# Patient Record
Sex: Male | Born: 1955 | Race: White | Hispanic: No | Marital: Married | Smoking: Never smoker
Health system: Southern US, Community
[De-identification: ages and names within clinical notes are randomized; demographics above are authoritative.]

## PROBLEM LIST (undated history)

## (undated) DIAGNOSIS — Z86010 Personal history of colonic polyps: Principal | ICD-10-CM

## (undated) DIAGNOSIS — K635 Polyp of colon: Secondary | ICD-10-CM

## (undated) HISTORY — PX: COLONOSCOPY: SHX174

## (undated) HISTORY — DX: Personal history of colonic polyps: Z86.010

## (undated) HISTORY — DX: Polyp of colon: K63.5

---

## 1983-09-06 HISTORY — PX: WISDOM TOOTH EXTRACTION: SHX21

## 1983-09-06 HISTORY — PX: KNEE SURGERY: SHX244

## 2011-05-17 DIAGNOSIS — Z860101 Personal history of adenomatous and serrated colon polyps: Secondary | ICD-10-CM

## 2011-05-17 DIAGNOSIS — Z86010 Personal history of colonic polyps: Secondary | ICD-10-CM

## 2011-05-17 HISTORY — DX: Personal history of colonic polyps: Z86.010

## 2011-05-17 HISTORY — DX: Personal history of adenomatous and serrated colon polyps: Z86.0101

## 2011-05-27 ENCOUNTER — Encounter (INDEPENDENT_AMBULATORY_CARE_PROVIDER_SITE_OTHER): Payer: Self-pay | Admitting: General Surgery

## 2011-06-01 ENCOUNTER — Encounter (INDEPENDENT_AMBULATORY_CARE_PROVIDER_SITE_OTHER): Payer: Self-pay | Admitting: General Surgery

## 2011-06-01 ENCOUNTER — Ambulatory Visit (INDEPENDENT_AMBULATORY_CARE_PROVIDER_SITE_OTHER): Payer: BC Managed Care – PPO | Admitting: General Surgery

## 2011-06-01 VITALS — BP 150/98 | HR 68 | Temp 97.1°F | Resp 16 | Ht 76.0 in | Wt 261.6 lb

## 2011-06-01 DIAGNOSIS — D126 Benign neoplasm of colon, unspecified: Secondary | ICD-10-CM

## 2011-06-01 NOTE — Progress Notes (Signed)
Subjective:     Patient ID: Patrick Newton, male   DOB: [birth year], 55 y.o.   MRN: [id number]  HPI Patient presents for evaluation for colectomy. Patient underwent screening colonoscopy by Dr. Bosie Clos. He was found to have a large proximal descending colon polyp. 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 The polyp was somewhat large and it was tattooed. He has had some small descending colon polyps removed. These are only tubular adenomas with no high-grade dysplasia. He has been doing well since his colonoscopy. He has not had change in bowel or bladder habits. He is having no abdominal pain  Review of Systems  Constitutional: Negative.   HENT: Negative.   Eyes: Negative.   Respiratory: Negative.   Cardiovascular: Negative.   Gastrointestinal: Negative.   Genitourinary: Negative.   Musculoskeletal: Negative.   Neurological: Negative.   Hematological: Negative.   Psychiatric/Behavioral: Negative.        Objective:   Physical Exam  Constitutional: He is oriented to person, place, and time. He appears well-developed and well-nourished.  HENT:  Head: Normocephalic and atraumatic.  Eyes: Conjunctivae are normal. Pupils are equal, round, and reactive to light. Right eye exhibits no discharge.  Neck: Normal range of motion. Neck supple. No tracheal deviation present.  Cardiovascular: Normal rate, regular rhythm and normal heart sounds.   Pulmonary/Chest: Effort normal and breath sounds normal. No respiratory distress. He has no wheezes. He has no rales. He exhibits no tenderness.  Abdominal: Soft. He exhibits no distension and no mass. There is no tenderness. There is no rebound and no guarding.  Musculoskeletal: Normal range of motion.  Neurological: He is alert and oriented to person, place, and time.  Skin: Skin is warm and dry.       Assessment:     Large tubulovillous adenoma proximal right colon.    Plan:     I have offered right colectomy. procedure risk and benefits  were discussed in detail the patient. These included but were not limited to bleeding, infection, injury to other organs, cardiac, and pulmonary palpitations. We also discussed the risk of anastomotic failure. We will plan a bowel prep. The patient had many questions that we discussed thoroughly. He is agreeable and looks forward to scheduling within the next few weeks.

## 2011-06-08 ENCOUNTER — Telehealth (INDEPENDENT_AMBULATORY_CARE_PROVIDER_SITE_OTHER): Payer: Self-pay | Admitting: General Surgery

## 2011-06-08 NOTE — Telephone Encounter (Signed)
Pt called in stated he is schedule for sx on 10/15 for right colectomy. Wanted to know if he can get a flu shot. Stated he never had one before. I asked Cyndra Numbers to reconfirm, and advised pt the shot should not be a problem.

## 2011-06-13 ENCOUNTER — Encounter (HOSPITAL_COMMUNITY)
Admission: RE | Admit: 2011-06-13 | Discharge: 2011-06-13 | Disposition: A | Discharge status: Home / Self Care | Payer: BC Managed Care – PPO | Source: Ambulatory Visit | Attending: General Surgery | Admitting: General Surgery

## 2011-06-13 LAB — SURGICAL PCR SCREEN
MRSA, PCR: NEGATIVE
Staphylococcus aureus: POSITIVE — AB

## 2011-06-13 LAB — CBC
Hemoglobin: 15 g/dL (ref 13.0–17.0)
MCV: 86.1 fL (ref 78.0–100.0)
Platelets: 206 10*3/uL (ref 150–400)
RBC: 5.02 MIL/uL (ref 4.22–5.81)
WBC: 5 10*3/uL (ref 4.0–10.5)

## 2011-06-13 LAB — BASIC METABOLIC PANEL
CO2: 32 mEq/L (ref 19–32)
Chloride: 103 mEq/L (ref 96–112)
Sodium: 141 mEq/L (ref 135–145)

## 2011-06-13 LAB — TYPE AND SCREEN
ABO/RH(D): A POS
Antibody Screen: NEGATIVE

## 2011-06-14 NOTE — Progress Notes (Signed)
I called and spoke to [facility] at [facility] preop and told her this was ok for or.

## 2011-06-20 ENCOUNTER — Other Ambulatory Visit (INDEPENDENT_AMBULATORY_CARE_PROVIDER_SITE_OTHER): Payer: Self-pay | Admitting: General Surgery

## 2011-06-20 ENCOUNTER — Inpatient Hospital Stay (HOSPITAL_COMMUNITY)
Admission: RE | Admit: 2011-06-20 | Discharge: 2011-06-25 | DRG: 148 | Disposition: A | Discharge status: Home / Self Care | Payer: BC Managed Care – PPO | Source: Ambulatory Visit | Attending: General Surgery | Admitting: General Surgery

## 2011-06-20 DIAGNOSIS — Z7982 Long term (current) use of aspirin: Secondary | ICD-10-CM

## 2011-06-20 DIAGNOSIS — D126 Benign neoplasm of colon, unspecified: Secondary | ICD-10-CM

## 2011-06-20 DIAGNOSIS — K560 Paralytic ileus: Secondary | ICD-10-CM | POA: Diagnosis not present

## 2011-06-20 DIAGNOSIS — R339 Retention of urine, unspecified: Secondary | ICD-10-CM | POA: Diagnosis not present

## 2011-06-20 DIAGNOSIS — Z01812 Encounter for preprocedural laboratory examination: Secondary | ICD-10-CM

## 2011-06-20 HISTORY — PX: COLON SURGERY: SHX602

## 2011-06-21 LAB — BASIC METABOLIC PANEL
Calcium: 8.6 mg/dL (ref 8.4–10.5)
Chloride: 104 mEq/L (ref 96–112)
Creatinine, Ser: 1.12 mg/dL (ref 0.50–1.35)
GFR calc Af Amer: 84 mL/min — ABNORMAL LOW (ref >90)
GFR calc non Af Amer: 73 mL/min — ABNORMAL LOW (ref >90)

## 2011-06-21 LAB — CBC
MCH: 29.3 pg (ref 26.0–34.0)
MCHC: 34 g/dL (ref 30.0–36.0)
MCV: 86.2 fL (ref 78.0–100.0)
Platelets: 203 10*3/uL (ref 150–400)
RDW: 13.5 % (ref 11.5–15.5)
WBC: 12.3 10*3/uL — ABNORMAL HIGH (ref 4.0–10.5)

## 2011-06-21 NOTE — Op Note (Signed)
Patrick Newton, SAWIN NO.:  [id number]  MEDICAL RECORD NO.:  [id number]  LOCATION:  5118                         FACILITY:  [facility]  PHYSICIAN:  Gabrielle Dare. Janee Morn, M.D.DATE OF BIRTH:  07-26-1956  DATE OF PROCEDURE:  06/20/2011 DATE OF DISCHARGE:                              OPERATIVE REPORT   PREOPERATIVE DIAGNOSIS:  Tubulovillous adenoma of right colon.  POSTOPERATIVE DIAGNOSIS:  Tubulovillous adenoma of right colon.  PROCEDURE:  Right colectomy.  SURGEON:  Gabrielle Dare. Janee Morn, MD  ASSISTANT:  Anselm Pancoast. Zachery Dakins, MD  HISTORY OF PRESENT ILLNESS:  Patrick Newton is a 55 year old gentleman who underwent screening colonoscopy by Dr. Bosie Clos.  He was found to have a tubulovillous adenoma in the proximal right colon.  He also had some smaller pedunculated tubular adenomas removed from his left colon.  The right colon polyp was large and unable to be totally removed.  It was tattooed during endoscopy.  I evaluated him in the office and we are proceeding with right colectomy.  PROCEDURE IN DETAIL:  Informed consent was obtained.  The patient was identified in the preop holding area.  His site was marked.  He received intravenous antibiotics.  He was brought to the operating room and general endotracheal anesthesia was administered by the Anesthesia staff.  His abdomen was prepped and draped in usual sterile fashion after placement of Foley catheter by nursing staff.  We did a time-out procedure.  A limited midline incision was made.  Subcutaneous tissues were dissected down revealing the anterior fascia.  This was divided along the midline, and the peritoneal cavity was entered under direct vision without difficulty.  Peritoneum was opened to the length of the incision.  Exploration revealed a palpable polyp in the proximal right colon with tattoo dye visible.  There were no other masses palpable in the right colon or transverse colon.  The liver was smooth both in  the right lobe and left lobe.  There were some small lymph nodes along the right colon mesentery, however, none appeared enlarged or abnormal.  The right colon was mobilized from its lateral peritoneal attachments along the white line of Toldt starting at the cecum movement up the right gutter.  This mobilization continued up and around the hepatic flexure. This hepatic flexure was somewhat high in order to keep our main incision small.  Attention was then directed to the mid transverse colon.  We selected an area to the right of the middle colic vessels and the colon was divided with a GIA 75 stapler.  We then took down the omentum and gastrocolic omentum there.  This facilitated easier mobilization of the hepatic flexure.  The mesentery of the transverse colon was then taken down for short distance with  the LigaSure achieving good hemostasis.  We were then able to sweep down the rest of the hepatic flexure.  It had completely mobilized.  The duodenum was swept down away.  This gave [place] good exposure of the mesentery with good mobilization.  The mesentery was then divided with the LigaSure for short distance, then as we encountered vessels including the right colon and ileum.  These were clamped and suture ligated with 2-0  silk and then doubly ligated with 2-0 silk.  After flashing, we continued taking the mesentery with a good lymph node dissection in this fashion.  Once we got down towards the terminal ileum, terminal ileum was freed up a little bit more.  The ileal fat pad was dissected free in that this terminal ileum was divided with a GIA 75 stapler.  We took down the rest of the mesentery as described above.  The specimen was passed off.  The gutter was inspected, there was no bleeding.  The transverse colon was then oriented side-by-side to the terminal ileum making ensure the remaining terminal ileum was nicely aligned with no twisting.  We placed a 2-0 silk crotch stitch and a  side-to-side anastomosis was made with a GIA 75 stapler.  The staple lines were checked, and there was no bleeding.  The resultant enterotomy was closed with a TA 60 stapler. Please note the bowel prep appeared good.  We placed a couple of figure- of-eight 3-0 silk sutures along the staple line to get excellent hemostasis.  We changed our gloves and got over the dirty instruments. The mesenteric defect was then closed with a series of interrupted 2-0 silk figure-of-eight sutures.  The anastomosis remained palpably patent. The intestine was viable as well.  The crotched area was reinspected and an additional 2-0 silk crotch stitch was placed and tied securely.  The abdomen was copiously irrigated.  There was no bleeding along the gutter or along the mesenteric pedicle.  The anastomosis was reinspected and remained completely viable.  Bowel and the omentum was returned back into anatomic position.  Irrigation fluid was evacuated and it was clear.  The fascia was then closed with 2 lengths of running #1 looped PDS from each end of the incision and tied in the middle.  Subcutaneous tissues were copiously irrigated, and the skin was closed with staples with a few interspersed Telfa wicks.  A bulky sterile dressing was applied.  All sponge, needle and instruments were reported correct. There were no apparent complications.  The patient tolerated the procedure well without apparent complication and was taken to the recovery room in stable condition.     Gabrielle Dare Janee Morn, M.D.     BET/MEDQ  D:  06/20/2011  T:  06/21/2011  Job:  [id number]  cc:   Shirley Friar, MD  Electronically Signed by Violeta Gelinas M.D. on 06/21/2011 01:32:45 PM

## 2011-06-22 DIAGNOSIS — R339 Retention of urine, unspecified: Secondary | ICD-10-CM

## 2011-06-28 ENCOUNTER — Telehealth (INDEPENDENT_AMBULATORY_CARE_PROVIDER_SITE_OTHER): Payer: Self-pay

## 2011-06-28 NOTE — Telephone Encounter (Signed)
Pt calling in to report that he has had some numbness and tingling at his quadricep muscle but it's not affecting his mobility or strength. The pt is still walking a day and nothing has gotten worse. I advised the pt if this gets worse to call our office back we would need to get pt in for appt. [facility]

## 2011-07-03 NOTE — Discharge Summary (Signed)
  Patrick Newton, Patrick Newton NO.:  [id number]  MEDICAL RECORD NO.:  [id number]  LOCATION:  5152                         FACILITY:  [facility]  PHYSICIAN:  Gabrielle Dare. Janee Morn, M.D.DATE OF BIRTH:  1956/08/03  DATE OF ADMISSION:  06/20/2011 DATE OF DISCHARGE:  06/25/2011                              DISCHARGE SUMMARY   DISCHARGE DIAGNOSES: 1. Tubulovillous adenoma right colon. 2. Status post right colectomy. 3. Urinary retention - resolved.  HISTORY OF PRESENT ILLNESS:  Mr. Patrick Newton is a 55 year old gentleman who presented for elective right colectomy for a large tubulovillous adenoma.  HOSPITAL COURSE:  The patient underwent an uncomplicated right colectomy.  Postoperatively, he was maintained on Entereg protocol.  His ileus gradually resolved.  He remained afebrile and hemodynamically stable.  He had some urinary retention after removal of his Foley, this improved after treatment with Urecholine, and is able to pass his urine easily for 48 hours prior to discharge.  He had good control of his pain and was moving his bowels and tolerated advancement of his diet and is discharged home on postoperative day 5 in stable condition.  DISCHARGE DIET:  Regular.  DISCHARGE ACTIVITY:  No lifting.  DISCHARGE MEDICATIONS:  Hydrocodone/acetaminophen 5/325 one to two p.o. q.4 h. p.r.n. pain.  He is also to continue his home medications of multivitamin and Tylenol and aspirin.  Followup is as scheduled with myself on July 06, 2011.     Gabrielle Dare Janee Morn, M.D.     BET/MEDQ  D:  06/25/2011  T:  06/25/2011  Job:  [id number]  cc:   Shirley Friar, MD  Electronically Signed by Violeta Gelinas M.D. on 07/03/2011 08:50:16 AM

## 2011-07-06 ENCOUNTER — Encounter (INDEPENDENT_AMBULATORY_CARE_PROVIDER_SITE_OTHER): Payer: Self-pay | Admitting: General Surgery

## 2011-07-06 ENCOUNTER — Ambulatory Visit (INDEPENDENT_AMBULATORY_CARE_PROVIDER_SITE_OTHER): Payer: BC Managed Care – PPO | Admitting: General Surgery

## 2011-07-06 DIAGNOSIS — Z9889 Other specified postprocedural states: Secondary | ICD-10-CM

## 2011-07-06 DIAGNOSIS — D126 Benign neoplasm of colon, unspecified: Secondary | ICD-10-CM

## 2011-07-06 DIAGNOSIS — K635 Polyp of colon: Secondary | ICD-10-CM

## 2011-07-06 DIAGNOSIS — Z9049 Acquired absence of other specified parts of digestive tract: Secondary | ICD-10-CM

## 2011-07-06 NOTE — Progress Notes (Signed)
Subjective:     Patient ID: Patrick Newton, male   DOB: 1956/06/15, 55 y.o.   MRN: [id number]  HPI  Patient presents status post right colectomy for large tubulovillous adenoma. He is doing well postoperatively. He is eating and moving his bowels usually once or twice a day. He's had no subjective fevers. He is no longer taking pain medicine. Review of Systems     Objective:   Physical Exam    Abdomen soft and nontender. Bowel sounds are active. His incision is healing well. Staples were removed and Steri-Strips were placed.Assessment:     Doing very well after right colectomy for tubulovillous adenoma.    Plan:     Avoid heavy lifting for 6 weeks after surgery. I will see him back in 3 weeks. Several questions are answered regarding activity. He is doing very well.

## 2011-07-06 NOTE — Patient Instructions (Signed)
No lifting over 10lbs for 6 weeks after surgery

## 2011-08-03 ENCOUNTER — Ambulatory Visit (INDEPENDENT_AMBULATORY_CARE_PROVIDER_SITE_OTHER): Payer: BC Managed Care – PPO | Admitting: General Surgery

## 2011-08-03 ENCOUNTER — Encounter (INDEPENDENT_AMBULATORY_CARE_PROVIDER_SITE_OTHER): Payer: Self-pay | Admitting: General Surgery

## 2011-08-03 VITALS — BP 164/92 | HR 62 | Temp 97.0°F | Resp 16 | Ht 76.0 in | Wt 257.5 lb

## 2011-08-03 DIAGNOSIS — D126 Benign neoplasm of colon, unspecified: Secondary | ICD-10-CM

## 2011-08-03 DIAGNOSIS — K635 Polyp of colon: Secondary | ICD-10-CM

## 2011-08-03 NOTE — Patient Instructions (Signed)
You may begin lifting including weights now.  Increase activity gradually.

## 2011-08-03 NOTE — Progress Notes (Signed)
Patient ID: Patrick Newton, male   DOB: Oct 20, 1955, 55 y.o.   MRN: [id number] Patient presents status post right colectomy for large tubulovillous adenoma on June 20, 2011. He's been doing well postoperatively. He is eating and moving his bowels at least daily. He has been holding off on any heavy lifting. He is not having any significant pain. He occasionally has some muscular strain type pain of his abdominal wall. On physical exam patient is awake and alert Abdomen is soft and nontender. His incision is well-healed without signs of infection. There no hernias. No masses are felt. Impression doing very well after right colectomy.  Plan he may gradually returned to normal activities including lifting weights. He will do this gradually over the next 2 weeks. He will follow up with gastroenterology for another colonoscopy in one year. I will see him back as needed.

## 2011-11-01 ENCOUNTER — Other Ambulatory Visit: Payer: Self-pay | Admitting: Dermatology

## 2013-07-09 ENCOUNTER — Other Ambulatory Visit: Payer: Self-pay | Admitting: Gastroenterology

## 2013-07-30 ENCOUNTER — Encounter (HOSPITAL_BASED_OUTPATIENT_CLINIC_OR_DEPARTMENT_OTHER): Payer: Self-pay | Admitting: Emergency Medicine

## 2013-07-30 ENCOUNTER — Emergency Department (HOSPITAL_BASED_OUTPATIENT_CLINIC_OR_DEPARTMENT_OTHER)
Admission: EM | Admit: 2013-07-30 | Discharge: 2013-07-30 | Disposition: A | Discharge status: Home / Self Care | Payer: BC Managed Care – PPO | Attending: Emergency Medicine | Admitting: Emergency Medicine

## 2013-07-30 ENCOUNTER — Emergency Department (HOSPITAL_BASED_OUTPATIENT_CLINIC_OR_DEPARTMENT_OTHER): Payer: BC Managed Care – PPO

## 2013-07-30 DIAGNOSIS — I10 Essential (primary) hypertension: Secondary | ICD-10-CM | POA: Insufficient documentation

## 2013-07-30 DIAGNOSIS — R1011 Right upper quadrant pain: Secondary | ICD-10-CM | POA: Insufficient documentation

## 2013-07-30 DIAGNOSIS — K8020 Calculus of gallbladder without cholecystitis without obstruction: Secondary | ICD-10-CM | POA: Insufficient documentation

## 2013-07-30 DIAGNOSIS — Z9089 Acquired absence of other organs: Secondary | ICD-10-CM | POA: Insufficient documentation

## 2013-07-30 LAB — COMPREHENSIVE METABOLIC PANEL
AST: 18 U/L (ref 0–37)
Albumin: 4.5 g/dL (ref 3.5–5.2)
Alkaline Phosphatase: 54 U/L (ref 39–117)
Calcium: 9.6 mg/dL (ref 8.4–10.5)
Creatinine, Ser: 1.2 mg/dL (ref 0.50–1.35)
GFR calc Af Amer: 76 mL/min — ABNORMAL LOW (ref >90)
GFR calc non Af Amer: 66 mL/min — ABNORMAL LOW (ref >90)
Sodium: 139 mEq/L (ref 135–145)
Total Bilirubin: 0.4 mg/dL (ref 0.3–1.2)
Total Protein: 7.1 g/dL (ref 6.0–8.3)

## 2013-07-30 LAB — CBC WITH DIFFERENTIAL/PLATELET
Basophils Relative: 0 % (ref 0–1)
Eosinophils Absolute: 0 10*3/uL (ref 0.0–0.7)
Hemoglobin: 14.3 g/dL (ref 13.0–17.0)
MCH: 29.4 pg (ref 26.0–34.0)
MCHC: 34.5 g/dL (ref 30.0–36.0)
Monocytes Absolute: 0.6 10*3/uL (ref 0.1–1.0)
Monocytes Relative: 5 % (ref 3–12)
Neutrophils Relative %: 85 % — ABNORMAL HIGH (ref 43–77)
RDW: 13.2 % (ref 11.5–15.5)

## 2013-07-30 LAB — LIPASE, BLOOD: Lipase: 49 U/L (ref 11–59)

## 2013-07-30 LAB — URINALYSIS, ROUTINE W REFLEX MICROSCOPIC
Bilirubin Urine: NEGATIVE
Ketones, ur: 15 mg/dL — AB
Nitrite: NEGATIVE
Protein, ur: NEGATIVE mg/dL
Urobilinogen, UA: 0.2 mg/dL (ref 0.0–1.0)
pH: 6 (ref 5.0–8.0)

## 2013-07-30 MED ORDER — ONDANSETRON HCL 4 MG/2ML IJ SOLN
4.0000 mg | Freq: Once | INTRAMUSCULAR | Status: AC
  Administered 2013-07-30: 4 mg via INTRAVENOUS
  Filled 2013-07-30: qty 2

## 2013-07-30 MED ORDER — SODIUM CHLORIDE 0.9 % IV SOLN
1000.0000 mL | Freq: Once | INTRAVENOUS | Status: AC
  Administered 2013-07-30: 1000 mL via INTRAVENOUS

## 2013-07-30 MED ORDER — IOHEXOL 300 MG/ML  SOLN
100.0000 mL | Freq: Once | INTRAMUSCULAR | Status: AC | PRN
  Administered 2013-07-30: 100 mL via INTRAVENOUS

## 2013-07-30 MED ORDER — SODIUM CHLORIDE 0.9 % IV SOLN
1000.0000 mL | INTRAVENOUS | Status: DC
  Administered 2013-07-30: 1000 mL via INTRAVENOUS

## 2013-07-30 MED ORDER — HYDROCODONE-ACETAMINOPHEN 5-325 MG PO TABS: 1.0000 | ORAL_TABLET | ORAL | Status: DC | PRN

## 2013-07-30 MED ORDER — HYDROMORPHONE HCL PF 1 MG/ML IJ SOLN
0.5000 mg | INTRAMUSCULAR | Status: DC | PRN
  Administered 2013-07-30: 0.5 mg via INTRAVENOUS
  Filled 2013-07-30: qty 1

## 2013-07-30 MED ORDER — IOHEXOL 300 MG/ML  SOLN
50.0000 mL | Freq: Once | INTRAMUSCULAR | Status: AC | PRN
  Administered 2013-07-30: 50 mL via ORAL

## 2013-07-30 MED ORDER — ONDANSETRON 8 MG PO TBDP: 8.0000 mg | ORAL_TABLET | Freq: Three times a day (TID) | ORAL | Status: DC | PRN

## 2013-07-30 NOTE — ED Provider Notes (Signed)
CSN: [id number]     Arrival date & time 07/30/13  0055 History   First MD Initiated Contact with Patient 07/30/13 0107     Chief Complaint  Patient presents with  . Abdominal Pain    Patient is a 57 y.o. male presenting with abdominal pain. The history is provided by the patient.  Abdominal Pain Pain location:  Epigastric and RUQ Pain quality: aching   Pain radiation: up towards the chest. Pain severity:  Moderate Onset quality:  Gradual Duration:  4 hours Timing:  Constant Progression:  Worsening Chronicity:  New Context: eating and previous surgery   Context: not retching and not trauma   Relieved by:  Nothing Worsened by:  Nothing tried Ineffective treatments:  None tried Associated symptoms: no anorexia, no chest pain, no chills, no cough, no diarrhea, no dysuria, no fever, no nausea and no vomiting     Past Medical History  Diagnosis Date  . Hypertension    Past Surgical History  Procedure Laterality Date  . Wisdom tooth extraction  1985  . Knee surgery  1985    arthroscopic  . Colon surgery  06/20/2011    rt colectomy   Family History  Problem Relation Age of Onset  . Cancer Mother     pancreatic  . Cancer Father     abdominal  . Cancer Maternal Aunt     breast   History  Substance Use Topics  . Smoking status: Never Smoker   . Smokeless tobacco: Never Used  . Alcohol Use: Yes     Comment: one beer every 2 weeks or less    Review of Systems  Constitutional: Negative for fever and chills.  Respiratory: Negative for cough.   Cardiovascular: Negative for chest pain.  Gastrointestinal: Positive for abdominal pain. Negative for nausea, vomiting, diarrhea and anorexia.  Genitourinary: Negative for dysuria.  All other systems reviewed and are negative.    Allergies  Review of patient's allergies indicates no known allergies.  Home Medications   Current Outpatient Rx  Name  Route  Sig  Dispense  Refill  . HYDROcodone-acetaminophen (NORCO/VICODIN)  5-325 MG per tablet   Oral   Take 1-2 tablets by mouth every 4 (four) hours as needed.   30 tablet   0   . ondansetron (ZOFRAN ODT) 8 MG disintegrating tablet   Oral   Take 1 tablet (8 mg total) by mouth every 8 (eight) hours as needed for nausea or vomiting.   20 tablet   0    BP 159/99  Pulse 52  Temp(Src) 98.1 F (36.7 C) (Oral)  Resp 20  SpO2 100% Physical Exam  Nursing note and vitals reviewed. Constitutional: He appears well-developed and well-nourished. No distress.  HENT:  Head: Normocephalic and atraumatic.  Right Ear: External ear normal.  Left Ear: External ear normal.  Eyes: Conjunctivae are normal. Right eye exhibits no discharge. Left eye exhibits no discharge. No scleral icterus.  Neck: Neck supple. No tracheal deviation present.  Cardiovascular: Normal rate, regular rhythm and intact distal pulses.   Pulmonary/Chest: Effort normal and breath sounds normal. No stridor. No respiratory distress. He has no wheezes. He has no rales.  Abdominal: Soft. Bowel sounds are normal. He exhibits no distension. There is tenderness in the right upper quadrant and epigastric area. There is guarding. There is no rigidity and no rebound. No hernia.  Well healed colectomy scar  Musculoskeletal: He exhibits no edema and no tenderness.  Neurological: He is alert. He has normal  strength. No sensory deficit. Cranial nerve deficit:  no gross defecits noted. He exhibits normal muscle tone. He displays no seizure activity. Coordination normal.  Skin: Skin is warm and dry. No rash noted.  Psychiatric: He has a normal mood and affect.    ED Course  Procedures (including critical care time) Labs Review Labs Reviewed  COMPREHENSIVE METABOLIC PANEL - Abnormal; Notable for the following:    Glucose, Bld 162 (*)    GFR calc non Af Amer 66 (*)    GFR calc Af Amer 76 (*)    All other components within normal limits  URINALYSIS, ROUTINE W REFLEX MICROSCOPIC - Abnormal; Notable for the  following:    Ketones, ur 15 (*)    All other components within normal limits  CBC WITH DIFFERENTIAL - Abnormal; Notable for the following:    WBC 11.6 (*)    Neutrophils Relative % 85 (*)    Neutro Abs 9.9 (*)    Lymphocytes Relative 9 (*)    All other components within normal limits  LIPASE, BLOOD   Imaging Review Ct Abdomen Pelvis W Contrast  07/30/2013   CLINICAL DATA:  Right lower quadrant pain and nausea. History of appendectomy, hemicolectomy, and colon polyps.  EXAM: CT ABDOMEN AND PELVIS WITH CONTRAST  TECHNIQUE: Multidetector CT imaging of the abdomen and pelvis was performed using the standard protocol following bolus administration of intravenous contrast.  CONTRAST:  50mL OMNIPAQUE IOHEXOL 300 MG/ML SOLN, OMNIPAQUE IOHEXOL 300 MG/ML SOLN  COMPARISON:  None.  FINDINGS: Mild dependent atelectasis in the lung bases.  Cholelithiasis and sludge in the gallbladder. No pericholecystic infiltration. The liver, spleen, pancreas, adrenal glands, abdominal aorta, inferior vena cava, and retroperitoneal lymph nodes are unremarkable. Small cysts on the kidneys measuring less than 1 cm diameter. No hydronephrosis. The stomach, small bowel, and colon are not abnormally distended. Minimal upper anterior abdominal wall hernia containing fat. No free fluid or free air in the abdomen.  Pelvis: The prostate gland is enlarged. The bladder wall is not thickened. The appendix is surgically absent. No evidence of diverticulitis. No free or loculated pelvic fluid collections. Ileal transverse colonic anastomosis appears patent. No significant lymphadenopathy in the pelvis. Degenerative changes in the lumbar spine. No destructive bone lesions appreciated.  IMPRESSION: Cholelithiasis and sludge in the gallbladder. No inflammatory changes around the gallbladder. Postoperative changes. Prostatic enlargement. Minimal fat containing upper anterior abdominal wall hernia.   Electronically Signed   By: Burman Nieves M.D.   On: 07/30/2013 03:39    EKG Interpretation    Date/Time:  Tuesday July 30 2013 01:09:41 EST Ventricular Rate:  50 PR Interval:  154 QRS Duration: 130 QT Interval:  456 QTC Calculation: 415 R Axis:   -36 Text Interpretation:  Sinus bradycardia Left axis deviation Right bundle branch block Moderate voltage criteria for LVH, may be normal variant Abnormal ECG No previous tracing Confirmed by Saloma Cadena  MD-J, Eunice Oldaker ([id number]) on 07/30/2013 1:20:23 AM           Medications  0.9 %  sodium chloride infusion (0 mLs Intravenous Stopped 07/30/13 0233)    Followed by  0.9 %  sodium chloride infusion (1,000 mLs Intravenous New Bag/Given 07/30/13 0235)  HYDROmorphone (DILAUDID) injection 0.5 mg (0.5 mg Intravenous Given 07/30/13 0140)  ondansetron (ZOFRAN) injection 4 mg (4 mg Intravenous Given 07/30/13 0140)  iohexol (OMNIPAQUE) 300 MG/ML solution 50 mL (50 mLs Oral Contrast Given 07/30/13 0310)  iohexol (OMNIPAQUE) 300 MG/ML solution 100 mL (100 mLs Intravenous  Contrast Given 07/30/13 0314)    0403  No abdominal ttp now after treatment. MDM   1. Cholelithiasis    Symptomatic biliary colic.  Exam and CT scan do not suggest cholecystitis.  Recc close follow up with surgery.  Discussed warning signs and precautions    Celene Kras, MD 07/30/13 [phone]

## 2013-07-30 NOTE — ED Notes (Signed)
C/o upper abd pain since 9pm, denies any diaphoresis. Denies N/V.

## 2013-08-07 ENCOUNTER — Ambulatory Visit (INDEPENDENT_AMBULATORY_CARE_PROVIDER_SITE_OTHER): Payer: BC Managed Care – PPO | Admitting: General Surgery

## 2013-08-07 ENCOUNTER — Encounter (INDEPENDENT_AMBULATORY_CARE_PROVIDER_SITE_OTHER): Payer: Self-pay | Admitting: General Surgery

## 2013-08-07 VITALS — BP 132/84 | HR 66 | Temp 97.1°F | Resp 16 | Ht 76.0 in | Wt 265.6 lb

## 2013-08-07 DIAGNOSIS — K8020 Calculus of gallbladder without cholecystitis without obstruction: Secondary | ICD-10-CM | POA: Insufficient documentation

## 2013-08-07 NOTE — Patient Instructions (Signed)
I will call you once ultrasound results are back

## 2013-08-07 NOTE — Progress Notes (Signed)
Patient ID: Patrick Newton, male   DOB: [birth year], 57 y.o.   MRN: [id number]  No chief complaint on file.   HPI Patrick Newton is a 57 y.o. male.  Chief complaint: Gallstones HPI The patient very well status post right colectomy. He has been doing well postoperatively. He recently had a colonoscopy with removal of a few polyps there has anastomosis. One was a tubulovillous adenoma. He recently traveled to [place] to see his brothers. On return, he developed acute right upper quadrant abdominal pain. He was seen at [facility] where he underwent a CT scan of the abdomen and pelvis. This demonstrated gallstones without evidence of acute cholecystitis. He has been on a low-fat diet since then and his pain is resolved. It did take a few days to completely go away.His mother, of note, history of pancreatic cancer and he is concerned regarding this.  Past Medical History  Diagnosis Date  . Hypertension     Past Surgical History  Procedure Laterality Date  . Wisdom tooth extraction  1985  . Knee surgery  1985    arthroscopic  . Colon surgery  06/20/2011    rt colectomy    Family History  Problem Relation Age of Onset  . Cancer Mother     pancreatic  . Cancer Father     abdominal  . Cancer Maternal Aunt     breast    Social History History  Substance Use Topics  . Smoking status: Never Smoker   . Smokeless tobacco: Never Used  . Alcohol Use: Yes     Comment: one beer every 2 weeks or less    No Known Allergies  No current outpatient prescriptions on file.   No current facility-administered medications for this visit.    Review of Systems Review of Systems  Constitutional: Negative.   HENT: Negative.   Eyes: Negative.   Respiratory: Negative.   Cardiovascular: Negative.   Gastrointestinal: Positive for abdominal pain.       See history of present illness  Endocrine: Negative.   Genitourinary: Negative.   Musculoskeletal: Negative.   Allergic/Immunologic:  Negative.   Neurological: Negative.   Hematological: Negative.   Psychiatric/Behavioral: Negative.     Blood pressure 132/84, pulse 66, temperature 97.1 F (36.2 C), temperature source Temporal, resp. rate 16, height 6\' 4"  (1.93 m), weight 265 lb 9.6 oz (120.475 kg).  Physical Exam Physical Exam  Constitutional: He is oriented to person, place, and time. He appears well-developed and well-nourished. No distress.  HENT:  Head: Normocephalic and atraumatic.  Mouth/Throat: No oropharyngeal exudate.  Eyes: EOM are normal. Pupils are equal, round, and reactive to light. No scleral icterus.  Neck: Normal range of motion. No tracheal deviation present. No thyromegaly present.  Cardiovascular: Normal rate, regular rhythm, normal heart sounds and intact distal pulses.   Pulmonary/Chest: Effort normal and breath sounds normal. No stridor. No respiratory distress. He has no wheezes. He has no rales.  Abdominal: Soft. Bowel sounds are normal. He exhibits no distension and no mass. There is no tenderness. There is no rebound and no guarding.  Small incisional hernia at the upper portion of his scar remains spontaneously reduced throughout exam, fascial defect only 1-2 cm. No right upper quadrant tenderness.  Musculoskeletal: Normal range of motion.  Neurological: He is alert and oriented to person, place, and time.  Skin: Skin is warm and dry.  Psychiatric: He has a normal mood and affect.    Data Reviewed CT  Assessment    Symptomatic cholelithiasis, small incisional hernia    Plan    I've offered laparoscopic cholecystectomy. We discussed the procedure, risks, and benefits in detail. We discussed the increased risk for converting to open  due to previous abdominal surgery with his right colectomy. He would like some more information prior to surgery and would like to have an ultrasound done. I believe this will add some further information. He is concerned regarding his mother's history  pancreatic cancer. I reviewed his CT results with him. I do not feel Histamal incisional hernia needs urgent surgery at this time. We will obtain the ultrasound and I will call him and we will discuss further plans at that time.       Taydem Cavagnaro E 08/07/2013, 11:57 AM

## 2013-08-08 ENCOUNTER — Ambulatory Visit
Admission: RE | Admit: 2013-08-08 | Discharge: 2013-08-08 | Disposition: A | Discharge status: Home / Self Care | Payer: BC Managed Care – PPO | Source: Ambulatory Visit | Attending: General Surgery | Admitting: General Surgery

## 2013-08-08 DIAGNOSIS — K8020 Calculus of gallbladder without cholecystitis without obstruction: Secondary | ICD-10-CM

## 2013-08-12 ENCOUNTER — Other Ambulatory Visit (INDEPENDENT_AMBULATORY_CARE_PROVIDER_SITE_OTHER): Payer: Self-pay | Admitting: General Surgery

## 2013-08-12 ENCOUNTER — Telehealth (INDEPENDENT_AMBULATORY_CARE_PROVIDER_SITE_OTHER): Payer: Self-pay | Admitting: General Surgery

## 2013-08-12 NOTE — Telephone Encounter (Signed)
I spoke to him about his U/S results. He is considering laparoscopic cholecystectomy in January. I discussed the procedure in detail. We discussed the risks and benefits of a laparoscopic cholecystectomy and possible cholangiogram including, but not limited to bleeding, infection, injury to surrounding structures such as the intestine or liver, bile leak, retained gallstones, need to convert to an open procedure, prolonged diarrhea, blood clots such as  DVT, common bile duct injury, anesthesia risks, and possible need for additional procedures.  The likelihood of improvement in symptoms and return to the patient's normal status is good. We discussed the typical post-operative recovery course. He will call scheduling once he speaks with his wife.

## 2013-08-14 ENCOUNTER — Encounter (INDEPENDENT_AMBULATORY_CARE_PROVIDER_SITE_OTHER): Payer: BC Managed Care – PPO | Admitting: General Surgery

## 2014-01-16 ENCOUNTER — Other Ambulatory Visit: Payer: Self-pay | Admitting: Dermatology

## 2014-04-17 IMAGING — CT CT ABD-PELV W/ CM
2 of 5 series · 17 of 46 positions shown, 19 images · IV contrast (APPLIED)
Comparison: None.

CLINICAL DATA: Right lower quadrant pain and nausea. History of
appendectomy, hemicolectomy, and colon polyps.

EXAM:
CT ABDOMEN AND PELVIS WITH CONTRAST
TECHNIQUE: Multidetector CT imaging of the abdomen and pelvis was performed
using the standard protocol following bolus administration of
intravenous contrast.
CONTRAST:  50mL OMNIPAQUE IOHEXOL 300 MG/ML SOLN, 100mL OMNIPAQUE
IOHEXOL 300 MG/ML SOLN

[Series 2: abd/pelvis 5.0 b31f · axial · 0.91mm/px · z∈[-496,-31]mm · 14 of 105 slices shown, 16 images]
[im 6/105  soft-tissue]
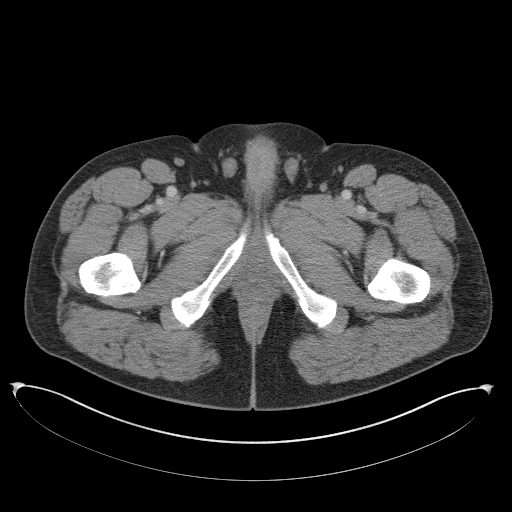
[im 6/105  bone]
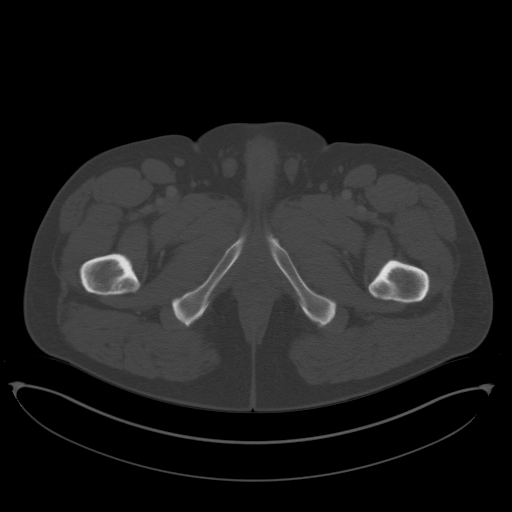
[im 11/105  soft-tissue]
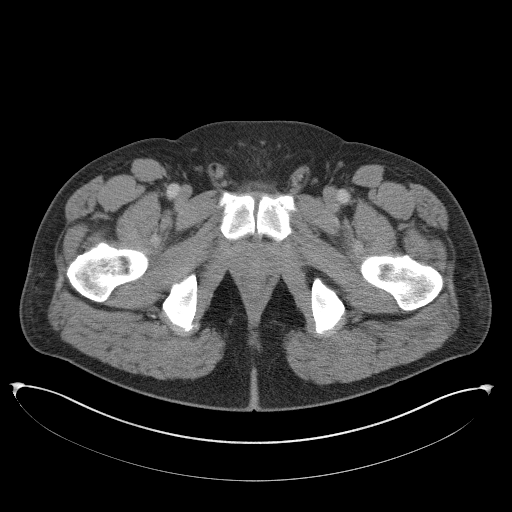
[im 22/105  soft-tissue]
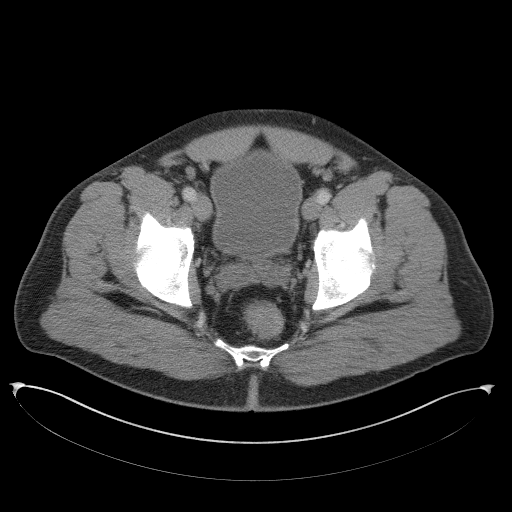
[im 28/105  soft-tissue]
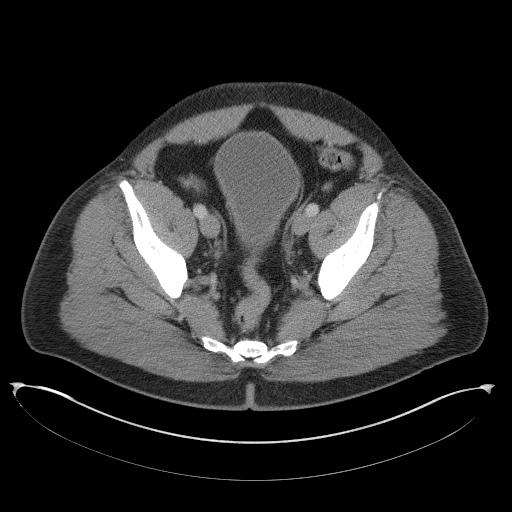
[im 33/105  soft-tissue]
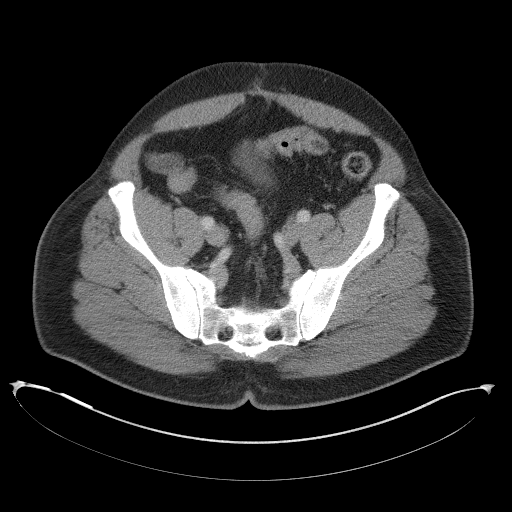
[im 44/105  soft-tissue]
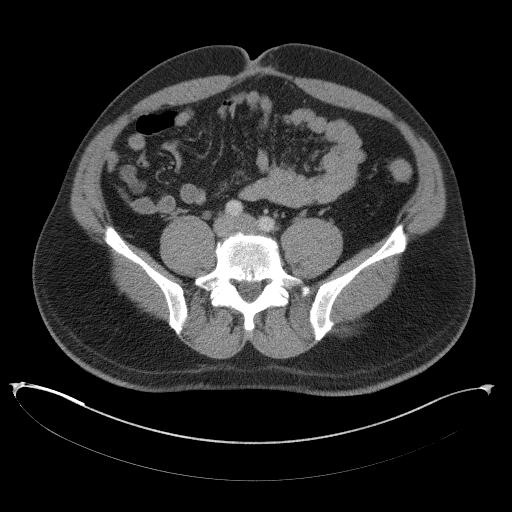
[im 50/105  soft-tissue]
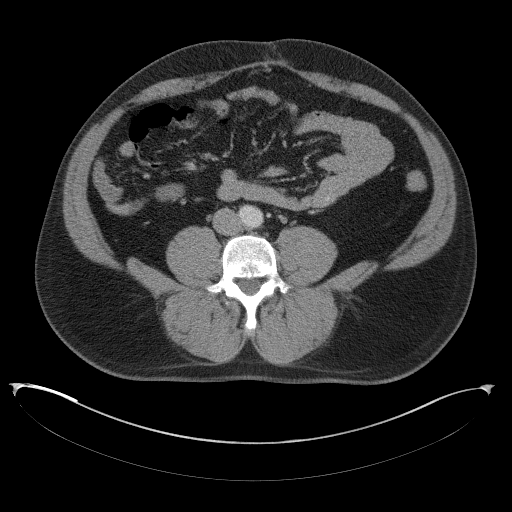
[im 55/105  soft-tissue]
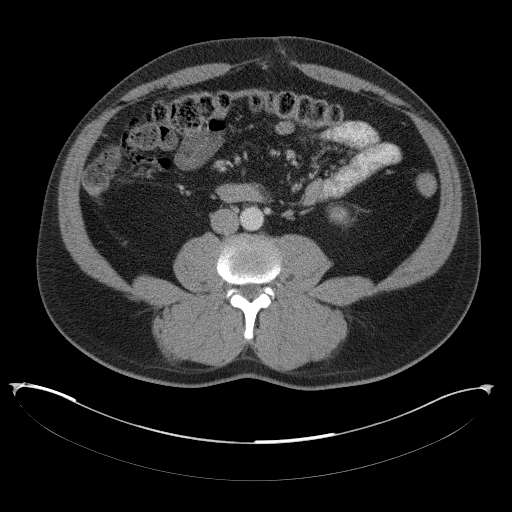
[im 61/105  soft-tissue]
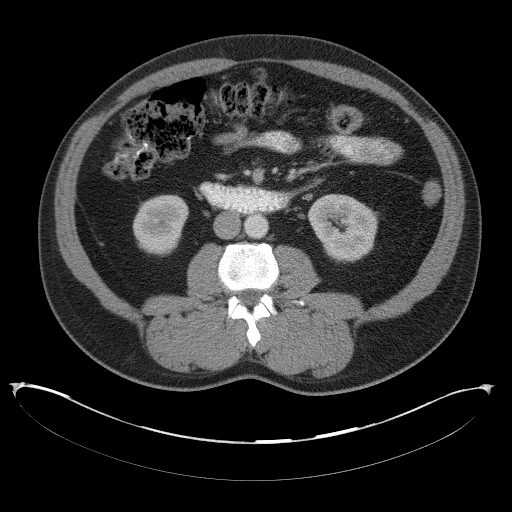
[im 61/105  bone]
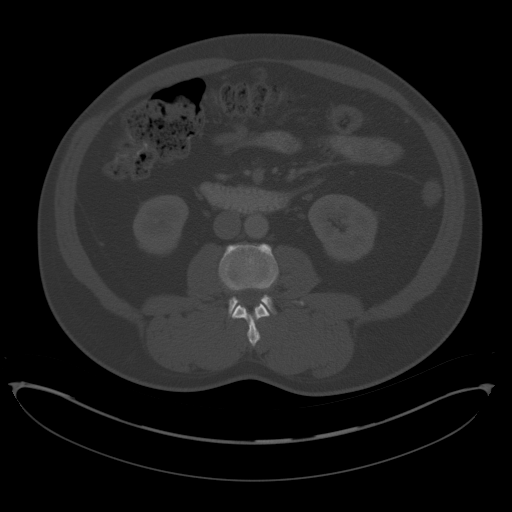
[im 72/105  soft-tissue]
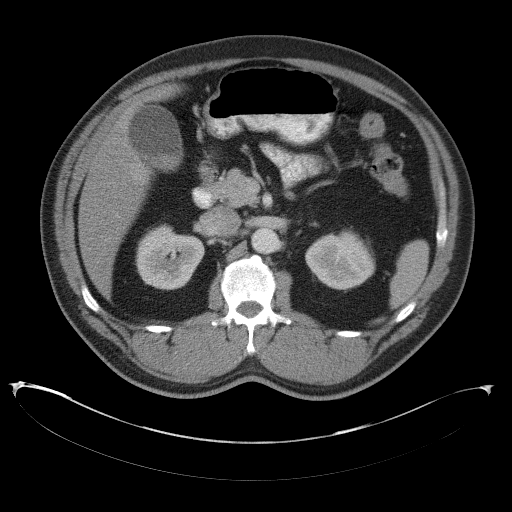
[im 77/105  soft-tissue]
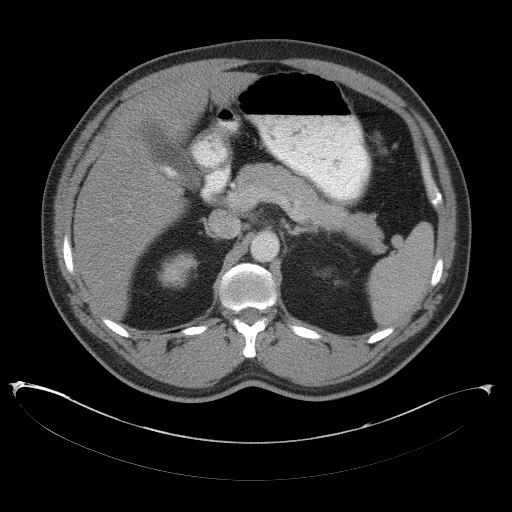
[im 83/105  soft-tissue]
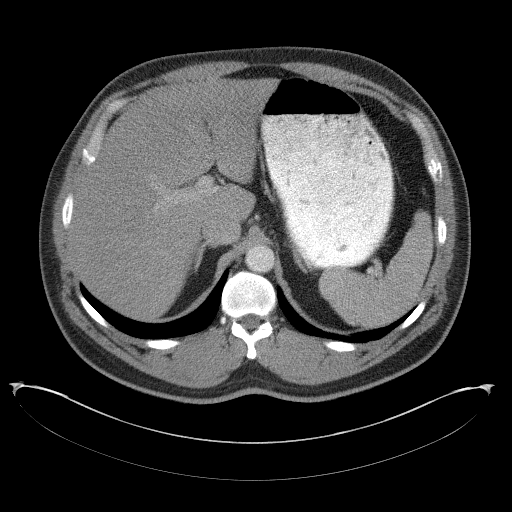
[im 94/105  soft-tissue]
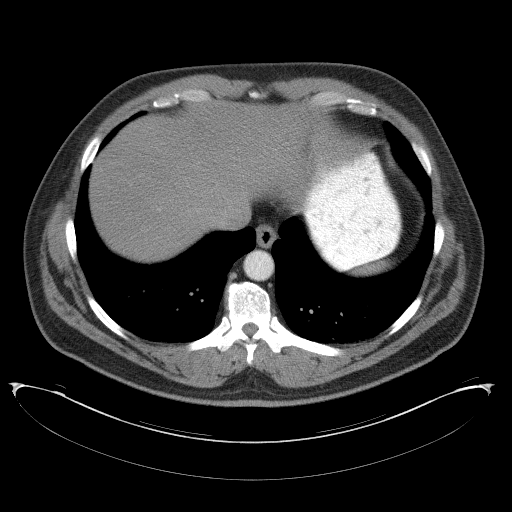
[im 99/105  soft-tissue]
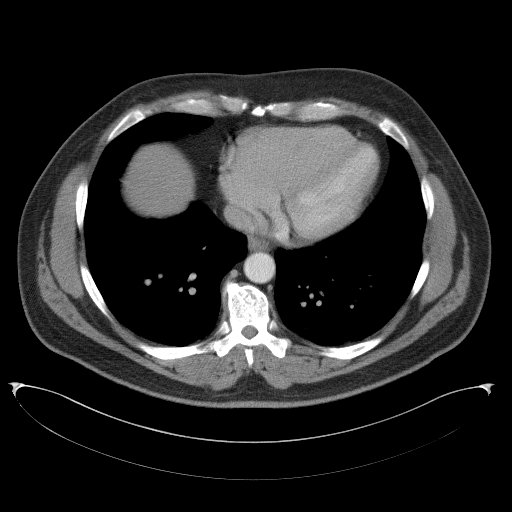

[Series 5: abd/pelvis 3.0 coronal · coronal · 0.99mm/px · 3 of 109 slices shown]
[im 37/109  soft-tissue]
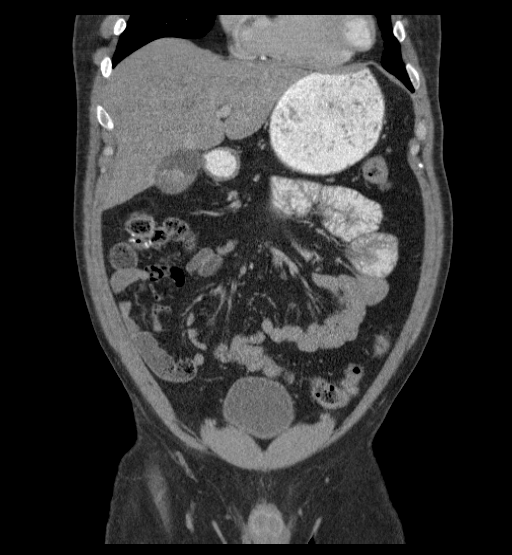
[im 49/109  soft-tissue]
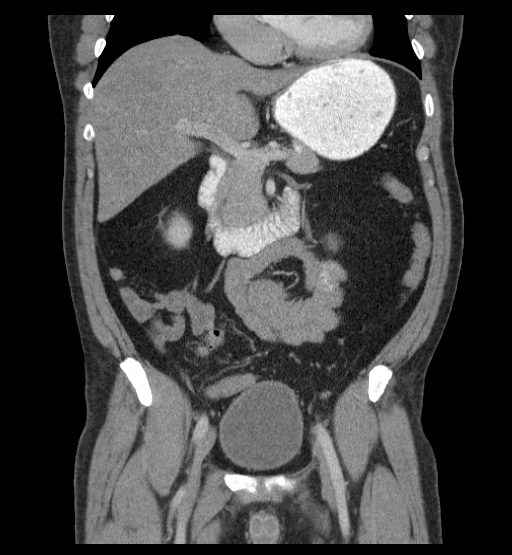
[im 61/109  soft-tissue]
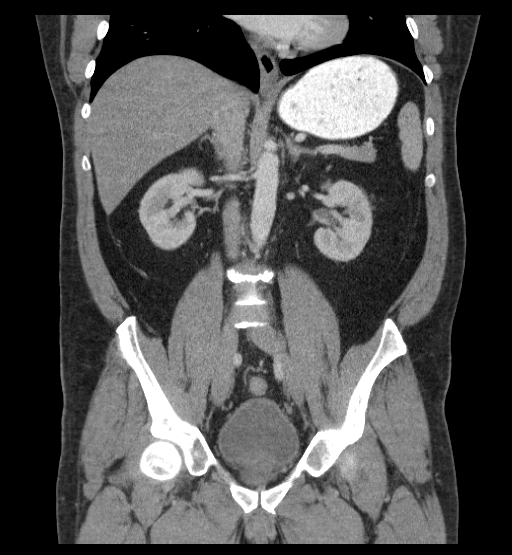

[17 of 46 positions shown; findings below may reference images not displayed]

FINDINGS: Mild dependent atelectasis in the lung bases.

Cholelithiasis and sludge in the gallbladder. No pericholecystic
infiltration. The liver, spleen, pancreas, adrenal glands, abdominal
aorta, inferior vena cava, and retroperitoneal lymph nodes are
unremarkable. Small cysts on the kidneys measuring less than 1 cm
diameter. No hydronephrosis. The stomach, small bowel, and colon are
not abnormally distended. Minimal upper anterior abdominal wall
hernia containing fat. No free fluid or free air in the abdomen.

Pelvis: The prostate gland is enlarged. The bladder wall is not
thickened. The appendix is surgically absent. No evidence of
diverticulitis. No free or loculated pelvic fluid collections. Ileal
transverse colonic anastomosis appears patent. No significant
lymphadenopathy in the pelvis. Degenerative changes in the lumbar
spine. No destructive bone lesions appreciated.
IMPRESSION: Cholelithiasis and sludge in the gallbladder. No inflammatory
changes around the gallbladder. Postoperative changes. Prostatic
enlargement. Minimal fat containing upper anterior abdominal wall
hernia.

## 2014-06-05 HISTORY — PX: OTHER SURGICAL HISTORY: SHX169

## 2014-09-05 HISTORY — PX: OTHER SURGICAL HISTORY: SHX169

## 2016-06-16 ENCOUNTER — Emergency Department (HOSPITAL_BASED_OUTPATIENT_CLINIC_OR_DEPARTMENT_OTHER)
Admission: EM | Admit: 2016-06-16 | Discharge: 2016-06-16 | Disposition: A | Discharge status: Home / Self Care | Payer: 59 | Attending: Emergency Medicine | Admitting: Emergency Medicine

## 2016-06-16 ENCOUNTER — Encounter (HOSPITAL_BASED_OUTPATIENT_CLINIC_OR_DEPARTMENT_OTHER): Payer: Self-pay | Admitting: *Deleted

## 2016-06-16 ENCOUNTER — Emergency Department (HOSPITAL_BASED_OUTPATIENT_CLINIC_OR_DEPARTMENT_OTHER): Payer: 59

## 2016-06-16 DIAGNOSIS — K8050 Calculus of bile duct without cholangitis or cholecystitis without obstruction: Secondary | ICD-10-CM | POA: Diagnosis not present

## 2016-06-16 DIAGNOSIS — I10 Essential (primary) hypertension: Secondary | ICD-10-CM | POA: Insufficient documentation

## 2016-06-16 DIAGNOSIS — R1011 Right upper quadrant pain: Secondary | ICD-10-CM

## 2016-06-16 LAB — COMPREHENSIVE METABOLIC PANEL
ALT: 21 U/L (ref 17–63)
AST: 14 U/L — AB (ref 15–41)
Albumin: 4.2 g/dL (ref 3.5–5.0)
Alkaline Phosphatase: 53 U/L (ref 38–126)
Anion gap: 10 (ref 5–15)
BILIRUBIN TOTAL: 0.3 mg/dL (ref 0.3–1.2)
BUN: 17 mg/dL (ref 6–20)
CO2: 26 mmol/L (ref 22–32)
CREATININE: 1.28 mg/dL — AB (ref 0.61–1.24)
Calcium: 9.6 mg/dL (ref 8.9–10.3)
Chloride: 104 mmol/L (ref 101–111)
GFR, EST NON AFRICAN AMERICAN: 60 mL/min — AB (ref >60)
Glucose, Bld: 158 mg/dL — ABNORMAL HIGH (ref 65–99)
POTASSIUM: 4.1 mmol/L (ref 3.5–5.1)
Sodium: 140 mmol/L (ref 135–145)
TOTAL PROTEIN: 6.9 g/dL (ref 6.5–8.1)

## 2016-06-16 LAB — CBC WITH DIFFERENTIAL/PLATELET
Basophils Absolute: 0 10*3/uL (ref 0.0–0.1)
Basophils Relative: 0 %
Eosinophils Absolute: 0.2 10*3/uL (ref 0.0–0.7)
Eosinophils Relative: 2 %
HCT: 42.1 % (ref 39.0–52.0)
Hemoglobin: 14.6 g/dL (ref 13.0–17.0)
Lymphocytes Relative: 14 %
Lymphs Abs: 1.4 10*3/uL (ref 0.7–4.0)
MCH: 29.8 pg (ref 26.0–34.0)
MCHC: 34.7 g/dL (ref 30.0–36.0)
MCV: 85.9 fL (ref 78.0–100.0)
Monocytes Absolute: 1.1 10*3/uL — ABNORMAL HIGH (ref 0.1–1.0)
Monocytes Relative: 11 %
Neutro Abs: 7.8 10*3/uL — ABNORMAL HIGH (ref 1.7–7.7)
Neutrophils Relative %: 73 %
Platelets: 223 10*3/uL (ref 150–400)
RBC: 4.9 MIL/uL (ref 4.22–5.81)
RDW: 13.1 % (ref 11.5–15.5)
WBC: 10.6 10*3/uL — ABNORMAL HIGH (ref 4.0–10.5)

## 2016-06-16 LAB — I-STAT CHEM 8, ED
BUN: 20 mg/dL (ref 6–20)
Calcium, Ion: 1.17 mmol/L (ref 1.15–1.40)
Chloride: 101 mmol/L (ref 101–111)
Creatinine, Ser: 1.2 mg/dL (ref 0.61–1.24)
Glucose, Bld: 157 mg/dL — ABNORMAL HIGH (ref 65–99)
HCT: 44 % (ref 39.0–52.0)
Hemoglobin: 15 g/dL (ref 13.0–17.0)
Potassium: 4 mmol/L (ref 3.5–5.1)
Sodium: 139 mmol/L (ref 135–145)
TCO2: 29 mmol/L (ref 0–100)

## 2016-06-16 LAB — LIPASE, BLOOD: Lipase: 25 U/L (ref 11–51)

## 2016-06-16 MED ORDER — MORPHINE SULFATE (PF) 4 MG/ML IV SOLN
4.0000 mg | Freq: Once | INTRAVENOUS | Status: AC
  Administered 2016-06-16: 4 mg via INTRAVENOUS
  Filled 2016-06-16: qty 1

## 2016-06-16 MED ORDER — ONDANSETRON HCL 4 MG/2ML IJ SOLN
4.0000 mg | Freq: Once | INTRAMUSCULAR | Status: AC
Start: 2016-06-16 — End: 2016-06-16
  Administered 2016-06-16: 4 mg via INTRAVENOUS
  Filled 2016-06-16: qty 2

## 2016-06-16 MED ORDER — OXYCODONE-ACETAMINOPHEN 5-325 MG PO TABS: 1.0000 | ORAL_TABLET | ORAL | 0 refills | Status: DC | PRN

## 2016-06-16 MED ORDER — ONDANSETRON HCL 4 MG PO TABS: 4.0000 mg | ORAL_TABLET | Freq: Four times a day (QID) | ORAL | 0 refills | Status: DC

## 2016-06-16 MED FILL — ONDANSETRON HCL 4 MG TABLET: 4 | 3 days supply | Qty: 12 | Fill #0

## 2016-06-16 MED FILL — OXYCODONE/APAP 5/325 MG TAB: 5-325 | 2 days supply | Qty: 15 | Fill #0

## 2016-06-16 NOTE — ED Triage Notes (Signed)
Pt reports intermittent RUQ pain since Friday. Nausea, bloating, gas, chills also

## 2016-06-16 NOTE — Discharge Instructions (Signed)
Please take medication as directed, please follow-up with Dr. Grandville Silos for reevaluation further management. Please return to the emergency room immediately. Parents any new or worsening signs or symptoms

## 2016-06-16 NOTE — ED Notes (Signed)
Non fat yogurt and a few fig bars at 6am last po intake.

## 2016-06-16 NOTE — ED Notes (Signed)
Pt reports pain has returned and is requesting medication. PA notified.

## 2016-06-16 NOTE — ED Provider Notes (Signed)
[place] [facility] Provider Note   CSN: [id number] Arrival date & time: 06/16/16  1028     History   Chief Complaint Chief Complaint  Patient presents with  . Abdominal Pain    HPI Patrick Newton is a 60 y.o. male.  HPI   60 year old male presents today with right upper quadrant abdominal pain. Patient notes that approximately one week ago he started having sharp right upper quadrant abdominal discomfort, this was off and on not persistent. Patient reports that symptoms have become more persistent and more severe. He reports last night was extremely painful, but acutely worsened this morning. He notes since this morning pain has been persistent, notes nausea, denies any vomiting. Patient reports that he hasn't had a bowel movement for 2 days, but notes he continues to pass gas. Patient reports a history of status post hemicolectomy, also notes a history of gallbladder sludge. Patient reports Dr. Georganna Skeans performed his surgeries. Patient denies any fever at home. Patient reports last by mouth intake was this morning around 6 AM, did not worsen symptoms.  Past Medical History:  Diagnosis Date  . Hypertension     Patient Active Problem List   Diagnosis Date Noted  . Gallstones 08/07/2013    Past Surgical History:  Procedure Laterality Date  . COLON SURGERY  06/20/2011   rt hemi-colectomy  . KNEE SURGERY  1985   arthroscopic  . [place] Medications    Prior to Admission medications   Medication Sig Start Date End Date Taking? Authorizing Provider  ondansetron (ZOFRAN) 4 MG tablet Take 1 tablet (4 mg total) by mouth every 6 (six) hours. 06/16/16   Okey Regal, PA-C  oxyCODONE-acetaminophen (PERCOCET/ROXICET) 5-325 MG tablet Take 1-2 tablets by mouth every 4 (four) hours as needed for severe pain. 06/16/16   Okey Regal, PA-C    Family History Family History  Problem Relation Age of Onset  . Cancer Mother     pancreatic   . Cancer Father     abdominal  . Cancer Maternal Aunt     breast    Social History Social History  Substance Use Topics  . Smoking status: Never Smoker  . Smokeless tobacco: Never Used  . Alcohol use Yes     Comment: one beer every 2 weeks or less     Allergies   Review of patient's allergies indicates no known allergies.   Review of Systems Review of Systems  All other systems reviewed and are negative.    Physical Exam Updated Vital Signs BP 131/79 (BP Location: Right Arm)   Pulse (!) 58   Temp 98.1 F (36.7 C) (Oral)   Resp (!) 28   Ht 6\' 4"  (1.93 m)   Wt 114.8 kg   SpO2 97%   BMI 30.80 kg/m   Physical Exam  Constitutional: He is oriented to person, place, and time. He appears well-developed and well-nourished.  HENT:  Head: Normocephalic and atraumatic.  Eyes: Conjunctivae are normal. Pupils are equal, round, and reactive to light. Right eye exhibits no discharge. Left eye exhibits no discharge. No scleral icterus.  Neck: Normal range of motion. No JVD present. No tracheal deviation present.  Pulmonary/Chest: Effort normal. No stridor.  Abdominal:  RUQ abdominal pain  Neurological: He is alert and oriented to person, place, and time. Coordination normal.  Skin: Skin is warm.  Psychiatric: He has a normal mood and affect. His behavior is normal. Judgment and thought  content normal.  Nursing note and vitals reviewed.   ED Treatments / Results  Labs (all labs ordered are listed, but only abnormal results are displayed) Labs Reviewed  COMPREHENSIVE METABOLIC PANEL - Abnormal; Notable for the following:       Result Value   Glucose, Bld 158 (*)    Creatinine, Ser 1.28 (*)    AST 14 (*)    GFR calc non Af Amer 60 (*)    All other components within normal limits  CBC WITH DIFFERENTIAL/PLATELET - Abnormal; Notable for the following:    WBC 10.6 (*)    Neutro Abs 7.8 (*)    Monocytes Absolute 1.1 (*)    All other components within normal limits    I-STAT CHEM 8, ED - Abnormal; Notable for the following:    Glucose, Bld 157 (*)    All other components within normal limits  LIPASE, BLOOD  CBC  URINALYSIS, ROUTINE W REFLEX MICROSCOPIC (NOT AT [facility])    EKG  EKG Interpretation None       Radiology US Abdomen Limited Ruq  Result Date: 06/16/2016 CLINICAL DATA:  Right upper quadrant pain with nausea and fever for the 6 days. History of gallstones EXAM: US ABDOMEN LIMITED - RIGHT UPPER QUADRANT COMPARISON:  Abdominal ultrasound of August 08, 2013 FINDINGS: Gallbladder: The gallbladder is adequately distended. There are multiple echogenic mobile shadowing stones. The largest measures 1.4 cm in diameter. There is no gallbladder wall thickening, pericholecystic fluid, or positive sonographic Murphy's sign. Common bile duct: Diameter: 5.6 mm Liver: The hepatic echotexture is mildly increased diffusely which was previously demonstrated. There is no focal mass nor ductal dilation. The surface contour of the liver remains smooth. IMPRESSION: Gallstones without sonographic evidence of acute cholecystitis. Fatty infiltrative change of the liver. Electronically Signed   By: David  Martinique M.D.   On: 06/16/2016 12:28    Procedures Procedures (including critical care time)  Medications Ordered in ED Medications  morphine 4 MG/ML injection 4 mg (4 mg Intravenous Given 06/16/16 1132)  ondansetron (ZOFRAN) injection 4 mg (4 mg Intravenous Given 06/16/16 1132)  morphine 4 MG/ML injection 4 mg (4 mg Intravenous Given 06/16/16 1231)     Initial Impression / Assessment and Plan / ED Course  I have reviewed the triage vital signs and the nursing notes.  Pertinent labs & imaging results that were available during my care of the patient were reviewed by me and considered in my medical decision making (see chart for details).  Clinical Course    Final Clinical Impressions(s) / ED Diagnoses   Final diagnoses:  RUQ pain  Biliary colic      Labs:   Imaging:  Consults:  Therapeutics:  Discharge Meds:   Assessment/Plan:   60 year old male presents today with likely biliary colic. He has no signs of cholecystitis, reassuring laboratory analysis showed no signs of obstruction or elevation white blood cells. He is afebrile and nontoxic. Patient doesn't appear to be uncomfortable here. I consult general surgery who recommended patient follow-up outpatient for further evaluation. I agree patient appears to have no infectious etiology on exam, he will be given pain medication and encouraged to call his general surgeon's office tomorrow morning for reevaluation. Patient was extensively counseled on return precautions, he is encouraged to return immediately if pain medication does not suffice or he expresses any new or worsening signs or symptoms. Patient was in agreement to today's plan had no further questions or concerns at time of discharge.  New Prescriptions Discharge Medication List as of 06/16/2016  2:27 PM    START taking these medications   Details  ondansetron (ZOFRAN) 4 MG tablet Take 1 tablet (4 mg total) by mouth every 6 (six) hours., Starting Thu 06/16/2016, Print    oxyCODONE-acetaminophen (PERCOCET/ROXICET) 5-325 MG tablet Take 1-2 tablets by mouth every 4 (four) hours as needed for severe pain., Starting Thu 06/16/2016, Print         Okey Regal, PA-C 06/16/16 [place], MD 06/17/16 [phone]

## 2016-06-22 ENCOUNTER — Ambulatory Visit: Payer: Self-pay | Admitting: General Surgery

## 2016-06-30 ENCOUNTER — Encounter (HOSPITAL_COMMUNITY)
Admission: RE | Admit: 2016-06-30 | Discharge: 2016-06-30 | Disposition: A | Discharge status: Home / Self Care | Payer: 59 | Source: Ambulatory Visit | Attending: General Surgery | Admitting: General Surgery

## 2016-06-30 ENCOUNTER — Encounter (HOSPITAL_COMMUNITY): Payer: Self-pay

## 2016-06-30 DIAGNOSIS — Z01818 Encounter for other preprocedural examination: Secondary | ICD-10-CM | POA: Diagnosis present

## 2016-06-30 DIAGNOSIS — I10 Essential (primary) hypertension: Secondary | ICD-10-CM | POA: Diagnosis not present

## 2016-06-30 LAB — BASIC METABOLIC PANEL
Anion gap: 8 (ref 5–15)
BUN: 16 mg/dL (ref 6–20)
CHLORIDE: 104 mmol/L (ref 101–111)
CO2: 26 mmol/L (ref 22–32)
Calcium: 9.5 mg/dL (ref 8.9–10.3)
Creatinine, Ser: 1.33 mg/dL — ABNORMAL HIGH (ref 0.61–1.24)
GFR, EST NON AFRICAN AMERICAN: 57 mL/min — AB (ref >60)
Glucose, Bld: 124 mg/dL — ABNORMAL HIGH (ref 65–99)
POTASSIUM: 4.4 mmol/L (ref 3.5–5.1)
SODIUM: 138 mmol/L (ref 135–145)

## 2016-06-30 LAB — CBC
HEMATOCRIT: 43.6 % (ref 39.0–52.0)
Hemoglobin: 14.6 g/dL (ref 13.0–17.0)
MCH: 29.3 pg (ref 26.0–34.0)
MCHC: 33.5 g/dL (ref 30.0–36.0)
MCV: 87.6 fL (ref 78.0–100.0)
PLATELETS: 310 10*3/uL (ref 150–400)
RBC: 4.98 MIL/uL (ref 4.22–5.81)
RDW: 13.1 % (ref 11.5–15.5)
WBC: 5.8 10*3/uL (ref 4.0–10.5)

## 2016-06-30 NOTE — Pre-Procedure Instructions (Signed)
    Patrick Newton  06/30/2016     Your procedure is scheduled on Monday, October 30.  Report to [facility] Admitting at 7:00 AM                 Your surgery or procedure is scheduled for 9:00 AM   Call this number if you have problems the morning of surgery:[phone]   Remember:  Do not eat food or drink liquids after midnight Sunday, October 30.  Take these medicines the morning of surgery with A SIP OF WATER: May use Flonase Nasal spray.  Take if needed : Tylenol.              Stop taking Vitamin.   Do not wear jewelry, make-up or nail polish.  Do not wear lotions, powders, or perfumes, or deodrant.  Men may shave face and neck.  Do not bring valuables to the hospital.  [facility] is not responsible for any belongings or valuables.  Contacts, dentures or bridgework may not be worn into surgery.  Leave your suitcase in the car.  After surgery it may be brought to your room.  For patients admitted to the hospital, discharge time will be determined by your treatment team.  Patients discharged the day of surgery will not be allowed to drive home.   Special instructions: Review  [facility] - Preparing For Surgery.  Please read over the following fact sheets that you were given: [facility]- Preparing For Surgery, Coughing and Deep Breathing, Pain Management

## 2016-07-04 ENCOUNTER — Encounter (HOSPITAL_COMMUNITY): Payer: Self-pay | Admitting: *Deleted

## 2016-07-04 ENCOUNTER — Encounter (HOSPITAL_COMMUNITY)
Admission: RE | Disposition: A | Discharge status: Home / Self Care | Payer: Self-pay | Source: Ambulatory Visit | Attending: General Surgery

## 2016-07-04 ENCOUNTER — Ambulatory Visit (HOSPITAL_COMMUNITY): Payer: 59 | Admitting: Anesthesiology

## 2016-07-04 ENCOUNTER — Ambulatory Visit (HOSPITAL_COMMUNITY)
Admission: RE | Admit: 2016-07-04 | Discharge: 2016-07-04 | Disposition: A | Discharge status: Home / Self Care | Payer: 59 | Source: Ambulatory Visit | Attending: General Surgery | Admitting: General Surgery

## 2016-07-04 DIAGNOSIS — K8010 Calculus of gallbladder with chronic cholecystitis without obstruction: Secondary | ICD-10-CM | POA: Diagnosis not present

## 2016-07-04 DIAGNOSIS — K8020 Calculus of gallbladder without cholecystitis without obstruction: Secondary | ICD-10-CM | POA: Diagnosis present

## 2016-07-04 DIAGNOSIS — Z86010 Personal history of colonic polyps: Secondary | ICD-10-CM | POA: Insufficient documentation

## 2016-07-04 HISTORY — PX: CHOLECYSTECTOMY: SHX55

## 2016-07-04 LAB — LIPASE, BLOOD: LIPASE: 43 U/L (ref 11–51)

## 2016-07-04 SURGERY — LAPAROSCOPIC CHOLECYSTECTOMY
Anesthesia: General | Site: Abdomen

## 2016-07-04 MED ORDER — FENTANYL CITRATE (PF) 100 MCG/2ML IJ SOLN
INTRAMUSCULAR | Status: AC
  Filled 2016-07-04: qty 2

## 2016-07-04 MED ORDER — OXYCODONE HCL 5 MG PO TABS
5.0000 mg | ORAL_TABLET | Freq: Once | ORAL | Status: AC | PRN
  Administered 2016-07-04: 5 mg via ORAL

## 2016-07-04 MED ORDER — ROCURONIUM BROMIDE 100 MG/10ML IV SOLN
INTRAVENOUS | Status: DC | PRN
  Administered 2016-07-04 (×2): 5 mg via INTRAVENOUS
  Administered 2016-07-04: 60 mg via INTRAVENOUS
  Administered 2016-07-04: 20 mg via INTRAVENOUS

## 2016-07-04 MED ORDER — FENTANYL CITRATE (PF) 100 MCG/2ML IJ SOLN: 25.0000 ug | INTRAMUSCULAR | Status: DC | PRN

## 2016-07-04 MED ORDER — IOPAMIDOL (ISOVUE-300) INJECTION 61%
INTRAVENOUS | Status: AC
  Filled 2016-07-04: qty 50

## 2016-07-04 MED ORDER — ONDANSETRON HCL 4 MG/2ML IJ SOLN
INTRAMUSCULAR | Status: DC | PRN
  Administered 2016-07-04: 4 mg via INTRAVENOUS

## 2016-07-04 MED ORDER — SUGAMMADEX SODIUM 200 MG/2ML IV SOLN
INTRAVENOUS | Status: AC
  Filled 2016-07-04: qty 2

## 2016-07-04 MED ORDER — ONDANSETRON HCL 4 MG/2ML IJ SOLN
INTRAMUSCULAR | Status: AC
  Filled 2016-07-04: qty 2

## 2016-07-04 MED ORDER — OXYCODONE HCL 5 MG PO TABS
ORAL_TABLET | ORAL | Status: AC
  Administered 2016-07-04: 5 mg via ORAL
  Filled 2016-07-04: qty 1

## 2016-07-04 MED ORDER — MIDAZOLAM HCL 2 MG/2ML IJ SOLN
INTRAMUSCULAR | Status: AC
  Filled 2016-07-04: qty 2

## 2016-07-04 MED ORDER — ARTIFICIAL TEARS OP OINT
TOPICAL_OINTMENT | OPHTHALMIC | Status: DC | PRN
  Administered 2016-07-04: 1 via OPHTHALMIC

## 2016-07-04 MED ORDER — 0.9 % SODIUM CHLORIDE (POUR BTL) OPTIME
TOPICAL | Status: DC | PRN
  Administered 2016-07-04: 1000 mL

## 2016-07-04 MED ORDER — FENTANYL CITRATE (PF) 100 MCG/2ML IJ SOLN
INTRAMUSCULAR | Status: AC
  Filled 2016-07-04: qty 4

## 2016-07-04 MED ORDER — FENTANYL CITRATE (PF) 100 MCG/2ML IJ SOLN
INTRAMUSCULAR | Status: DC | PRN
  Administered 2016-07-04 (×2): 50 ug via INTRAVENOUS
  Administered 2016-07-04: 150 ug via INTRAVENOUS
  Administered 2016-07-04: 25 ug via INTRAVENOUS

## 2016-07-04 MED ORDER — LIDOCAINE HCL (CARDIAC) 20 MG/ML IV SOLN
INTRAVENOUS | Status: DC | PRN
Start: 2016-07-04 — End: 2016-07-04
  Administered 2016-07-04: 60 mg via INTRAVENOUS

## 2016-07-04 MED ORDER — MIDAZOLAM HCL 5 MG/5ML IJ SOLN
INTRAMUSCULAR | Status: DC | PRN
  Administered 2016-07-04: 2 mg via INTRAVENOUS

## 2016-07-04 MED ORDER — ARTIFICIAL TEARS OP OINT
TOPICAL_OINTMENT | OPHTHALMIC | Status: AC
  Filled 2016-07-04: qty 3.5

## 2016-07-04 MED ORDER — BUPIVACAINE-EPINEPHRINE (PF) 0.5% -1:200000 IJ SOLN
INTRAMUSCULAR | Status: AC
  Filled 2016-07-04: qty 30

## 2016-07-04 MED ORDER — EPHEDRINE SULFATE 50 MG/ML IJ SOLN
INTRAMUSCULAR | Status: DC | PRN
  Administered 2016-07-04 (×2): 10 mg via INTRAVENOUS

## 2016-07-04 MED ORDER — OXYCODONE HCL 5 MG PO TABS: 5.0000 mg | ORAL_TABLET | Freq: Four times a day (QID) | ORAL | 0 refills | Status: DC | PRN

## 2016-07-04 MED ORDER — LIDOCAINE HCL 4 % EX SOLN
CUTANEOUS | Status: DC | PRN
  Administered 2016-07-04: 3 mL via TOPICAL

## 2016-07-04 MED ORDER — SODIUM CHLORIDE 0.9 % IR SOLN
Status: DC | PRN
  Administered 2016-07-04 (×3): 1000 mL

## 2016-07-04 MED ORDER — SUGAMMADEX SODIUM 200 MG/2ML IV SOLN
INTRAVENOUS | Status: DC | PRN
  Administered 2016-07-04: 200 mg via INTRAVENOUS

## 2016-07-04 MED ORDER — LIDOCAINE 2% (20 MG/ML) 5 ML SYRINGE
INTRAMUSCULAR | Status: AC
  Filled 2016-07-04: qty 5

## 2016-07-04 MED ORDER — PROPOFOL 10 MG/ML IV BOLUS
INTRAVENOUS | Status: AC
  Filled 2016-07-04: qty 40

## 2016-07-04 MED ORDER — BUPIVACAINE-EPINEPHRINE 0.25% -1:200000 IJ SOLN
INTRAMUSCULAR | Status: DC | PRN
  Administered 2016-07-04: 16 mL

## 2016-07-04 MED ORDER — CHLORHEXIDINE GLUCONATE CLOTH 2 % EX PADS: 6.0000 | MEDICATED_PAD | Freq: Once | CUTANEOUS | Status: DC

## 2016-07-04 MED ORDER — SODIUM CHLORIDE 0.9 % IV SOLN
INTRAVENOUS | Status: DC | PRN
  Administered 2016-07-04: .1 mL

## 2016-07-04 MED ORDER — LACTATED RINGERS IV SOLN
INTRAVENOUS | Status: DC
Start: 2016-07-04 — End: 2016-07-04
  Administered 2016-07-04 (×3): via INTRAVENOUS

## 2016-07-04 MED ORDER — CEFAZOLIN SODIUM-DEXTROSE 2-4 GM/100ML-% IV SOLN
2.0000 g | INTRAVENOUS | Status: AC
  Administered 2016-07-04: 2 g via INTRAVENOUS
  Filled 2016-07-04: qty 100

## 2016-07-04 MED ORDER — OXYCODONE HCL 5 MG/5ML PO SOLN: 5.0000 mg | Freq: Once | ORAL | Status: AC | PRN

## 2016-07-04 MED ORDER — PROPOFOL 10 MG/ML IV BOLUS
INTRAVENOUS | Status: DC | PRN
  Administered 2016-07-04: 200 mg via INTRAVENOUS

## 2016-07-04 MED ORDER — ROCURONIUM BROMIDE 10 MG/ML (PF) SYRINGE
PREFILLED_SYRINGE | INTRAVENOUS | Status: AC
  Filled 2016-07-04: qty 10

## 2016-07-04 SURGICAL SUPPLY — 45 items
APPLIER CLIP 5 13 M/L LIGAMAX5 (MISCELLANEOUS) ×8
BLADE SURG ROTATE 9660 (MISCELLANEOUS) IMPLANT
CANISTER SUCTION 2500CC (MISCELLANEOUS) ×4 IMPLANT
CHLORAPREP W/TINT 26ML (MISCELLANEOUS) ×4 IMPLANT
CLIP APPLIE 5 13 M/L LIGAMAX5 (MISCELLANEOUS) ×4 IMPLANT
COVER MAYO STAND STRL (DRAPES) ×4 IMPLANT
COVER SURGICAL LIGHT HANDLE (MISCELLANEOUS) ×4 IMPLANT
DERMABOND ADVANCED (GAUZE/BANDAGES/DRESSINGS) ×2
DERMABOND ADVANCED .7 DNX12 (GAUZE/BANDAGES/DRESSINGS) ×2 IMPLANT
DRAPE C-ARM 42X72 X-RAY (DRAPES) ×4 IMPLANT
ELECT REM PT RETURN 9FT ADLT (ELECTROSURGICAL) ×4
ELECTRODE REM PT RTRN 9FT ADLT (ELECTROSURGICAL) ×2 IMPLANT
FILTER SMOKE EVAC LAPAROSHD (FILTER) IMPLANT
GLOVE BIO SURGEON STRL SZ8 (GLOVE) ×4 IMPLANT
GLOVE BIOGEL PI IND STRL 8 (GLOVE) ×2 IMPLANT
GLOVE BIOGEL PI INDICATOR 8 (GLOVE) ×2
GOWN STRL REUS W/ TWL LRG LVL3 (GOWN DISPOSABLE) ×4 IMPLANT
GOWN STRL REUS W/ TWL XL LVL3 (GOWN DISPOSABLE) ×2 IMPLANT
GOWN STRL REUS W/TWL LRG LVL3 (GOWN DISPOSABLE) ×4
GOWN STRL REUS W/TWL XL LVL3 (GOWN DISPOSABLE) ×2
HEMOSTAT SNOW SURGICEL 2X4 (HEMOSTASIS) ×4 IMPLANT
KIT BASIN OR (CUSTOM PROCEDURE TRAY) ×4 IMPLANT
KIT ROOM TURNOVER OR (KITS) ×4 IMPLANT
L-HOOK LAP DISP 36CM (ELECTROSURGICAL) ×4
LHOOK LAP DISP 36CM (ELECTROSURGICAL) ×2 IMPLANT
NEEDLE 22X1 1/2 (OR ONLY) (NEEDLE) ×4 IMPLANT
NS IRRIG 1000ML POUR BTL (IV SOLUTION) ×4 IMPLANT
PAD ARMBOARD 7.5X6 YLW CONV (MISCELLANEOUS) ×4 IMPLANT
PENCIL BUTTON HOLSTER BLD 10FT (ELECTRODE) ×4 IMPLANT
POUCH RETRIEVAL ECOSAC 10 (ENDOMECHANICALS) ×2 IMPLANT
POUCH RETRIEVAL ECOSAC 10MM (ENDOMECHANICALS) ×2
SCISSORS LAP 5X35 DISP (ENDOMECHANICALS) ×4 IMPLANT
SET CHOLANGIOGRAPH 5 50 .035 (SET/KITS/TRAYS/PACK) ×4 IMPLANT
SET IRRIG TUBING LAPAROSCOPIC (IRRIGATION / IRRIGATOR) ×4 IMPLANT
SLEEVE ENDOPATH XCEL 5M (ENDOMECHANICALS) ×8 IMPLANT
SPECIMEN JAR SMALL (MISCELLANEOUS) ×4 IMPLANT
SUT VIC AB 4-0 PS2 27 (SUTURE) ×4 IMPLANT
SUT VICRYL 0 UR6 27IN ABS (SUTURE) ×4 IMPLANT
TOWEL OR 17X24 6PK STRL BLUE (TOWEL DISPOSABLE) ×4 IMPLANT
TOWEL OR 17X26 10 PK STRL BLUE (TOWEL DISPOSABLE) ×4 IMPLANT
TRAY LAPAROSCOPIC MC (CUSTOM PROCEDURE TRAY) ×4 IMPLANT
TROCAR BLADELESS 11MM (ENDOMECHANICALS) ×4 IMPLANT
TROCAR XCEL BLUNT TIP 100MML (ENDOMECHANICALS) ×4 IMPLANT
TROCAR XCEL NON-BLD 5MMX100MML (ENDOMECHANICALS) ×4 IMPLANT
TUBING INSUFFLATION (TUBING) ×4 IMPLANT

## 2016-07-04 NOTE — Anesthesia Procedure Notes (Signed)
Procedure Name: Intubation Date/Time: 07/04/2016 8:58 AM Performed by: Suzy Bouchard Pre-anesthesia Checklist: Patient identified, Emergency Drugs available, Suction available, Timeout performed and Patient being monitored Patient Re-evaluated:Patient Re-evaluated prior to inductionOxygen Delivery Method: Circle system utilized Preoxygenation: Pre-oxygenation with 100% oxygen Intubation Type: IV induction Ventilation: Mask ventilation without difficulty Laryngoscope Size: Miller and 2 Grade View: Grade I Tube type: Oral Laser Tube: Cuffed inflated with minimal occlusive pressure - saline Tube size: 7.5 mm Number of attempts: 1 Airway Equipment and Method: Stylet and LTA kit utilized Placement Confirmation: ETT inserted through vocal cords under direct vision,  positive ETCO2 and breath sounds checked- equal and bilateral Secured at: 23 cm Tube secured with: Tape Dental Injury: Teeth and Oropharynx as per pre-operative assessment

## 2016-07-04 NOTE — Anesthesia Preprocedure Evaluation (Addendum)
Anesthesia Evaluation  Patient identified by MRN, date of birth, ID band Patient awake    Reviewed: Allergy & Precautions, NPO status , Patient's Chart, lab work & pertinent test results  History of Anesthesia Complications Negative for: history of anesthetic complications  Airway Mallampati: II  TM Distance: >3 FB Neck ROM: Full    Dental  (+) Teeth Intact, Dental Advisory Given   Pulmonary neg pulmonary ROS,    breath sounds clear to auscultation       Cardiovascular negative cardio ROS   Rhythm:Regular     Neuro/Psych negative neurological ROS  negative psych ROS   GI/Hepatic Neg liver ROS,   Endo/Other  negative endocrine ROS  Renal/GU negative Renal ROS     Musculoskeletal negative musculoskeletal ROS (+)   Abdominal   Peds  Hematology negative hematology ROS (+)   Anesthesia Other Findings   Reproductive/Obstetrics                            Anesthesia Physical Anesthesia Plan  ASA: II  Anesthesia Plan: General   Post-op Pain Management:    Induction: Intravenous  Airway Management Planned: Oral ETT  Additional Equipment: None  Intra-op Plan:   Post-operative Plan: Extubation in OR  Informed Consent: I have reviewed the patients History and Physical, chart, labs and discussed the procedure including the risks, benefits and alternatives for the proposed anesthesia with the patient or authorized representative who has indicated his/her understanding and acceptance.   Dental advisory given  Plan Discussed with: CRNA and Surgeon  Anesthesia Plan Comments:         Anesthesia Quick Evaluation

## 2016-07-04 NOTE — Interval H&P Note (Signed)
History and Physical Interval Note:  07/04/2016 8:25 AM  Patrick Newton  has presented today for surgery, with the diagnosis of SYMPTOMATIC CHOLELITHIASIS  The various methods of treatment have been discussed with the patient and family. After consideration of risks, benefits and other options for treatment, the patient has consented to  Procedure(s): LAPAROSCOPIC CHOLECYSTECTOMY WITH INTRAOPERATIVE CHOLANGIOGRAM (N/A) as a surgical intervention .  The patient's history has been reviewed, patient examined, no change in status, stable for surgery.  I have reviewed the patient's chart and labs.  Questions were answered to the patient's satisfaction.     [name] E

## 2016-07-04 NOTE — Transfer of Care (Signed)
Immediate Anesthesia Transfer of Care Note  Patient: YAACOV KIRKMAN  Procedure(s) Performed: Procedure(s): LAPAROSCOPIC CHOLECYSTECTOMY (N/A)  Patient Location: PACU  Anesthesia Type:General  Level of Consciousness: awake, alert  and oriented  Airway & Oxygen Therapy: Patient Spontanous Breathing and Patient connected to nasal cannula oxygen  Post-op Assessment: Report given to RN, Post -op Vital signs reviewed and stable and Patient moving all extremities X 4  Post vital signs: Reviewed and stable  Last Vitals:  Vitals:   07/04/16 0758  BP: (!) 136/93  Pulse: (!) 55  Resp: 20  Temp: 36.7 C    Last Pain:  Vitals:   07/04/16 0758  TempSrc: Oral      Patients Stated Pain Goal: 2 ([id number])  Complications: No apparent anesthesia complications

## 2016-07-04 NOTE — Op Note (Signed)
07/04/2016  11:24 AM  PATIENT:  Patrick Newton  60 y.o. male  PRE-OPERATIVE DIAGNOSIS:  SYMPTOMATIC CHOLELITHIASIS  POST-OPERATIVE DIAGNOSIS:  CHRONIC CHOLECYSTITIS WITH CHOLELITHIASIS  PROCEDURE:  Procedure(s): LAPAROSCOPIC CHOLECYSTECTOMY  SURGEON:  Georganna Skeans, MD  ASSISTANTS: Judeth Horn, MD   ANESTHESIA:   local and general  EBL:  Total I/O In: 1000 [I.V.:1000] Out: -   BLOOD ADMINISTERED:none  DRAINS: none   SPECIMEN:  Excision  DISPOSITION OF SPECIMEN:  PATHOLOGY  COUNTS:  YES  DICTATION: .Dragon Dictation Findings: Severe chronic cholecystitis including some necrosis of the gallbladder wall, cystic duct very short  Procedure in detail: Patrick Newton presents for cholecystectomy. He was identified in the preop holding area. Informed consent was obtained. He received intravenous antibiotics. He was brought to the operative room and general endotracheal anesthesia was administered by the anesthesia staff. His abdomen was prepped and draped in a sterile fashion. We did a time out procedure.The supraumbilical region was infiltrated with local. Supraumbilical incision was made. Subcutaneous tissues were dissected down revealing the anterior fascia. This was divided sharply along the midline. Peritoneal cavity was entered under direct vision without complication. Some adhesions were gently swept away. A 0 Vicryl pursestring was placed around the fascial opening. Hassan trocar was inserted into the abdomen. The abdomen was insufflated with carbon dioxide in standard fashion. Under direct vision a 5 mm epigastric and 5 mm right lateral ports 2 were placed. Local was used at each port site. Laparoscopic exploration revealed omentum adherent to an inflamed gallbladder dome. This was gradually and carefully taken down. Once the dome was further revealed, it was grasped and retracted superior medially. The omentum was further taken down from the gallbladder revealing the body and  eventually, the infundibulum. Cardio was used to get good hemostasis on the omentum. Dissection began laterally and progressed medially. First we identified an anterior and posterior branch of the cystic artery. These were clipped twice proximally, once distally and divided. Further gradual dissection identifying the cystic duct. It was a bit wide but was short. Decision was made not to do a cholangiogram. Further dissection continued until we had a critical view of the cystic duct, and the infundibulum and the liver. 3 clips were placed proximally on the cystic duct, one was placed distally and it was divided. Gallbladder was taken off the liver bed using Bovie cautery. It was placed in a bag and removed from the abdomen via the supraumbilical port site. The epigastric port was upsized to 11 mm to allow a larger sucker and the stone grasper as a few stones spilled. The small stones were gathered up. The gallbladder bed was cauterized to get hemostasis. A piece of Surgicel snow was applied to complete this. It was then dry. Abdomen was irrigated. Ports were removed under direct vision. Pneumoperitoneum was released. Supraumbilical fascia was closed with multiple interrupted 0 Vicryl stitches. All 4 wounds were irrigated and the skin of each was closed with running 4 Vicryl subcuticular. All counts were correct. He tolerated procedure well without apparent complications and was taken recovery in stable condition.  PATIENT DISPOSITION:  PACU - hemodynamically stable.   Delay start of Pharmacological VTE agent (>24hrs) due to surgical blood loss or risk of bleeding:  no  Georganna Skeans, MD, MPH, FACS Pager: [phone]  10/30/201711:24 AM

## 2016-07-04 NOTE — H&P (Signed)
Patrick Newton 06/22/2016 9:23 AM Location: [facility] Patient #: [id number] DOB: [birth year] Married / Language: Patrick Newton / Race: White Male   History of Present Illness Patrick Neri E. Grandville Silos MD; 06/22/2016 10:01 AM) The patient is a 60 year old male who presents for evaluation of gallbladder disease. Patrick Newton is well known to me status post right colectomy for tubulovillous adenoma in 2012. I saw him in 2014 for symptomatically cholelithiasis. He did not end up scheduling cholecystectomy at that time. He wanted to change his diet and see how that helps. It actually worked well for about 3 years but recently developed intermittent attacks of right upper quadrant abdominal pain. The most recent one happened a week or so ago when he went to the emergency department. Ultrasound demonstrated multiple gallstones. No evidence of cholecystitis. Since then the pain has improved significantly. He still feels a little bit bloated. Patrick Bras, FNP, asked me to see him in consultation regarding consideration for cholecystectomy.   Other Problems Patrick Newton, Patrick Newton; 06/22/2016 9:23 AM) Cholelithiasis Other disease, cancer, significant illness  Past Surgical History Patrick Newton, CMA; 06/22/2016 9:23 AM) Colon Polyp Removal - Colonoscopy Colon Removal - Partial Knee Surgery Left. Oral Surgery Tonsillectomy  Diagnostic Studies History (Patrick Newton; 06/22/2016 9:23 AM) Colonoscopy 1-5 years ago  Allergies Patrick Newton, Patrick Newton; 06/22/2016 9:24 AM) No Known Drug Allergies10/18/2017  Medication History (Patrick Newton, CMA; 06/22/2016 9:24 AM) Tylenol (325MG  Tablet, Oral as needed) Active. Medications Reconciled  Social History (Patrick Newton; 06/22/2016 9:23 AM) Caffeine use Tea. No alcohol use No drug use Tobacco use Never smoker.  Family History Patrick Newton, [name]; 06/22/2016 9:23 AM) Alcohol Abuse Brother, Father. Breast Cancer Family Members In  General. Cancer Family Members In General, Father. Colon Cancer Family Members In General. Malignant Neoplasm Of Pancreas Family Members In General, Mother. Ovarian Cancer Family Members In General.    Review of Systems Patrick Newton CMA; 06/22/2016 9:23 AM) General Present- Appetite Loss, Chills, Fatigue and Weight Loss. Not Present- Fever, Night Sweats and Weight Gain. Skin Not Present- Change in Wart/Mole, Dryness, Hives, Jaundice, New Lesions, Non-Healing Wounds, Rash and Ulcer. HEENT Present- Ringing in the Ears. Not Present- Earache, Hearing Loss, Hoarseness, Nose Bleed, Oral Ulcers, Seasonal Allergies, Sinus Pain, Sore Throat, Visual Disturbances, Wears glasses/contact lenses and Yellow Eyes. Respiratory Not Present- Bloody sputum, Chronic Cough, Difficulty Breathing, Snoring and Wheezing. Breast Not Present- Breast Mass, Breast Pain, Nipple Discharge and Skin Changes. Cardiovascular Not Present- Chest Pain, Difficulty Breathing Lying Down, Leg Cramps, Palpitations, Rapid Heart Rate, Shortness of Breath and Swelling of Extremities. Gastrointestinal Present- Abdominal Pain, Bloating and Change in Bowel Habits. Not Present- Bloody Stool, Chronic diarrhea, Constipation, Difficulty Swallowing, Excessive gas, Gets full quickly at meals, Hemorrhoids, Indigestion, Nausea, Rectal Pain and Vomiting. Male Genitourinary Not Present- Blood in Urine, Change in Urinary Stream, Frequency, Impotence, Nocturia, Painful Urination, Urgency and Urine Leakage. Musculoskeletal Not Present- Back Pain, Joint Pain, Joint Stiffness, Muscle Pain, Muscle Weakness and Swelling of Extremities. Neurological Not Present- Decreased Memory, Fainting, Headaches, Numbness, Seizures, Tingling, Tremor, Trouble walking and Weakness. Psychiatric Not Present- Anxiety, Bipolar, Change in Sleep Pattern, Depression, Fearful and Frequent crying. Endocrine Present- Hot flashes. Not Present- Cold Intolerance, Excessive Hunger, Hair  Changes, Heat Intolerance and New Diabetes. Hematology Not Present- Blood Thinners, Easy Bruising, Excessive bleeding, Gland problems, HIV and Persistent Infections.  Vitals (Patrick Newton CMA; 06/22/2016 9:24 AM) 06/22/2016 9:23 AM Weight: 254 lb Height: 76in Body Surface Area: 2.45 m Body Mass Index: 30.92 kg/m  Temp.: 97.86F(Temporal)  Pulse: 80 (Regular)  BP: 132/76 (Sitting, Left Arm, Standard)       Physical Exam (Patrick Keil E. Grandville Silos MD; 06/22/2016 9:59 AM) General Mental Status-Alert. General Appearance-Consistent with stated age. Hydration-Well hydrated. Voice-Normal.  Head and Neck Head-normocephalic, atraumatic with no lesions or palpable masses. Trachea-midline. Thyroid Gland Characteristics - normal size and consistency.  Eye Eyeball - Bilateral-Extraocular movements intact. Sclera/Conjunctiva - Bilateral-No scleral icterus.  Chest and Lung Exam Chest and lung exam reveals -quiet, even and easy respiratory effort with no use of accessory muscles and on auscultation, normal breath sounds, no adventitious sounds and normal vocal resonance. Inspection Chest Wall - Normal. Back - normal.  Cardiovascular Cardiovascular examination reveals -normal heart sounds, regular rate and rhythm with no murmurs and normal pedal pulses bilaterally.  Abdomen Inspection Inspection of the abdomen reveals - No Hernias. Palpation/Percussion Palpation and Percussion of the abdomen reveal - Soft, No Rebound tenderness, No Rigidity (guarding) and No hepatosplenomegaly. Note: Mild right upper quadrant tenderness without guarding. Tenderness - Right Upper Quadrant. Auscultation Auscultation of the abdomen reveals - Bowel sounds normal.  Neurologic Neurologic evaluation reveals -alert and oriented x 3 with no impairment of recent or remote memory. Mental Status-Normal.  Musculoskeletal Global Assessment -Note: no gross deformities.  Normal  Exam - Left-Upper Extremity Strength Normal and Lower Extremity Strength Normal. Normal Exam - Right-Upper Extremity Strength Normal and Lower Extremity Strength Normal.  Lymphatic Head & Neck  General Head & Neck Lymphatics: Bilateral - Description - Normal. Axillary  General Axillary Region: Bilateral - Description - Normal. Tenderness - Non Tender. Femoral & Inguinal  Generalized Femoral & Inguinal Lymphatics: Bilateral - Description - No Generalized lymphadenopathy.    Assessment & Plan Patrick Neri E. Grandville Silos MD; 06/22/2016 10:00 AM) SYMPTOMATIC CHOLELITHIASIS (K80.20) Impression: I have offered laparoscopic cholecystectomy with intraoperative coverage for him. The procedure, risks, benefits were discussed in detail with him. He is agreeable and is to schedule in the near future. He will stay on his low-fat diet in the interim. We discussed the expected postoperative course.

## 2016-07-05 ENCOUNTER — Encounter (HOSPITAL_COMMUNITY): Payer: Self-pay | Admitting: General Surgery

## 2016-07-05 NOTE — Anesthesia Postprocedure Evaluation (Signed)
Anesthesia Post Note  Patient: Patrick Newton  Procedure(s) Performed: Procedure(s) (LRB): LAPAROSCOPIC CHOLECYSTECTOMY (N/A)  Patient location during evaluation: PACU Anesthesia Type: General Level of consciousness: awake Pain management: pain level controlled Vital Signs Assessment: post-procedure vital signs reviewed and stable Respiratory status: spontaneous breathing Cardiovascular status: stable Postop Assessment: no signs of nausea or vomiting Anesthetic complications: no    Last Vitals:  Vitals:   07/04/16 1225 07/04/16 1230  BP: 131/85   Pulse: 63 (!) 55  Resp: 11 (!) 9  Temp:      Last Pain:  Vitals:   07/04/16 1422  TempSrc:   PainSc: 0-No pain                 Aleanna Menge

## 2016-10-03 ENCOUNTER — Ambulatory Visit (AMBULATORY_SURGERY_CENTER): Payer: Self-pay

## 2016-10-03 VITALS — Ht 76.0 in | Wt 260.0 lb

## 2016-10-03 DIAGNOSIS — Z860100 Personal history of colon polyps, unspecified: Secondary | ICD-10-CM

## 2016-10-03 NOTE — Progress Notes (Signed)
No allergic to eggs or soy No diet meds No home oxygen No past problems with anesthesia  Declined emmi

## 2016-10-07 ENCOUNTER — Ambulatory Visit (AMBULATORY_SURGERY_CENTER): Payer: 59 | Admitting: Internal Medicine

## 2016-10-07 ENCOUNTER — Encounter: Payer: Self-pay | Admitting: Internal Medicine

## 2016-10-07 VITALS — BP 117/72 | HR 62 | Temp 98.0°F | Resp 15 | Ht 76.0 in | Wt 260.0 lb

## 2016-10-07 DIAGNOSIS — D123 Benign neoplasm of transverse colon: Secondary | ICD-10-CM | POA: Diagnosis not present

## 2016-10-07 DIAGNOSIS — Z86010 Personal history of colonic polyps: Secondary | ICD-10-CM

## 2016-10-07 MED ORDER — SODIUM CHLORIDE 0.9 % IV SOLN: 500.0000 mL | INTRAVENOUS | Status: DC

## 2016-10-07 NOTE — Op Note (Signed)
[place] Patient Name: Patrick Newton Procedure Date: 10/07/2016 2:20 PM MRN: [id number] Endoscopist: Gatha Mayer , MD Age: 61 Referring MD:  Date of Birth: 1955-09-11 Gender: Male Account #: [id number] Procedure:                Colonoscopy Indications:              Surveillance: Personal history of adenomatous                            polyps on last colonoscopy > 3 years ago Medicines:                Propofol per Anesthesia, Monitored Anesthesia Care Procedure:                Pre-Anesthesia Assessment:                           - Prior to the procedure, a History and Physical                            was performed, and patient medications and                            allergies were reviewed. The patient's tolerance of                            previous anesthesia was also reviewed. The risks                            and benefits of the procedure and the sedation                            options and risks were discussed with the patient.                            All questions were answered, and informed consent                            was obtained. Prior Anticoagulants: The patient has                            taken no previous anticoagulant or antiplatelet                            agents. ASA Grade Assessment: II - A patient with                            mild systemic disease. After reviewing the risks                            and benefits, the patient was deemed in                            satisfactory condition to undergo the procedure.  After obtaining informed consent, the colonoscope                            was passed under direct vision. Throughout the                            procedure, the patient's blood pressure, pulse, and                            oxygen saturations were monitored continuously. The                            Model CF-HQ190L [id number]) scope was introduced   through the anus and advanced to the the                            ileocolonic anastomosis. The colonoscopy was                            performed without difficulty. The patient tolerated                            the procedure well. The quality of the bowel                            preparation was good. The rectum and Ileocolonic                            anastomsis areas were photographed. The bowel                            preparation used was Miralax. Scope In: 2:33:05 PM Scope Out: 2:50:58 PM Scope Withdrawal Time: 0 hours 13 minutes 21 seconds  Total Procedure Duration: 0 hours 17 minutes 53 seconds  Findings:                 The perianal and digital rectal examinations were                            normal. Pertinent negatives include normal prostate                            (size, shape, and consistency).                           A 5 mm polyp was found in the transverse colon. The                            polyp was sessile. The polyp was removed with a                            cold snare. Resection and retrieval were complete.                            Verification of  patient identification for the                            specimen was done. Estimated blood loss was minimal.                           There was evidence of a prior end-to-side                            ileo-colonic anastomosis in the ascending colon.                            This was patent and was characterized by healthy                            appearing mucosa. The anastomosis was traversed.                           The exam was otherwise without abnormality on                            direct and retroflexion views. Complications:            No immediate complications. Estimated Blood Loss:     Estimated blood loss: none. Impression:               - One 5 mm polyp in the transverse colon, removed                            with a cold snare. Resected and retrieved.                            - Patent end-to-side ileo-colonic anastomosis,                            characterized by healthy appearing mucosa.                           - The examination was otherwise normal on direct                            and retroflexion views.                           - Personal history of colonic polyps. Large adenoma                            resected 2012 and other adenomas max 10 mm 2014 Recommendation:           - Patient has a contact number available for                            emergencies. The signs and symptoms of potential  delayed complications were discussed with the                            patient. Return to normal activities tomorrow.                            Written discharge instructions were provided to the                            patient.                           - Resume previous diet.                           - Continue present medications.                           - Repeat colonoscopy is recommended for                            surveillance. The colonoscopy date will be                            determined after pathology results from today's                            exam become available for review. Gatha Mayer, MD 10/07/2016 3:09:04 PM This report has been signed electronically.

## 2016-10-07 NOTE — Patient Instructions (Addendum)
I found one small polyp - removed. I will let you know pathology results and when to have another routine colonoscopy by mail. Suspect it will be in 5 yrs.  I appreciate the opportunity to care for you. Gatha Mayer, MD, [facility]   Impression/Recommendations:  Polyp handout given to patient.  Repeat colonoscopy recommended for surveillance.  YOU HAD AN ENDOSCOPIC PROCEDURE TODAY AT [facility]:   Refer to the procedure report that was given to you for any specific questions about what was found during the examination.  If the procedure report does not answer your questions, please call your gastroenterologist to clarify.  If you requested that your care partner not be given the details of your procedure findings, then the procedure report has been included in a sealed envelope for you to review at your convenience later.  YOU SHOULD EXPECT: Some feelings of bloating in the abdomen. Passage of more gas than usual.  Walking can help get rid of the air that was put into your GI tract during the procedure and reduce the bloating. If you had a lower endoscopy (such as a colonoscopy or flexible sigmoidoscopy) you may notice spotting of blood in your stool or on the toilet paper. If you underwent a bowel prep for your procedure, you may not have a normal bowel movement for a few days.  Please Note:  You might notice some irritation and congestion in your nose or some drainage.  This is from the oxygen used during your procedure.  There is no need for concern and it should clear up in a day or so.  SYMPTOMS TO REPORT IMMEDIATELY:   Following lower endoscopy (colonoscopy or flexible sigmoidoscopy):  Excessive amounts of blood in the stool  Significant tenderness or worsening of abdominal pains  Swelling of the abdomen that is new, acute  Fever of 100F or higher  For urgent or emergent issues, a gastroenterologist can be reached at any hour by calling [phone]  [phone].   DIET:  We do recommend a small meal at first, but then you may proceed to your regular diet.  Drink plenty of fluids but you should avoid alcoholic beverages for 24 hours.  ACTIVITY:  You should plan to take it easy for the rest of today and you should NOT DRIVE or use heavy machinery until tomorrow (because of the sedation medicines used during the test).    FOLLOW UP: Our staff will call the number listed on your records the next business day following your procedure to check on you and address any questions or concerns that you may have regarding the information given to you following your procedure. If we do not reach you, we will leave a message.  However, if you are feeling well and you are not experiencing any problems, there is no need to return our call.  We will assume that you have returned to your regular daily activities without incident.  If any biopsies were taken you will be contacted by phone or by letter within the next 1-3 weeks.  Please call us at [phone] if you have not heard about the biopsies in 3 weeks.    SIGNATURES/CONFIDENTIALITY: You and/or your care partner have signed paperwork which will be entered into your electronic medical record.  These signatures attest to the fact that that the information above on your After Visit Summary has been reviewed and is understood.  Full responsibility of the confidentiality of this discharge information lies with  you and/or your care-partner.

## 2016-10-07 NOTE — Progress Notes (Signed)
Report to PACU, RN, vss, BBS= Clear.  

## 2016-10-07 NOTE — Progress Notes (Signed)
Called to room to assist during endoscopic procedure.  Patient ID and intended procedure confirmed with present staff. Received instructions for my participation in the procedure from the performing physician.  

## 2016-10-10 ENCOUNTER — Telehealth: Payer: Self-pay | Admitting: *Deleted

## 2016-10-10 ENCOUNTER — Telehealth: Payer: Self-pay

## 2016-10-10 NOTE — Telephone Encounter (Signed)
  Follow up Call-  Call back number 10/07/2016  Post procedure Call Back phone  # [phone]  Permission to leave phone message Yes  Some recent data might be hidden     Patient wanted to thank everyone for the excellent care he received when he was in the Endoscopy center.  Patient questions:  Do you have a fever, pain , or abdominal swelling? No. Pain Score  0 *  Have you tolerated food without any problems? Yes.    Have you been able to return to your normal activities? Yes.    Do you have any questions about your discharge instructions: Diet   No. Medications  No. Follow up visit  No.  Do you have questions or concerns about your Care? No.  Actions: * If pain score is 4 or above: No action needed, pain <4.

## 2016-10-10 NOTE — Telephone Encounter (Signed)
No answer. Left message. Will call back this afternoon.

## 2016-10-13 ENCOUNTER — Encounter: Payer: Self-pay | Admitting: Internal Medicine

## 2016-10-13 DIAGNOSIS — Z86010 Personal history of colonic polyps: Secondary | ICD-10-CM

## 2016-10-13 NOTE — Progress Notes (Signed)
5 mm adenoma Recall 2023

## 2016-10-20 NOTE — Telephone Encounter (Signed)
Opened in error. SM

## 2017-03-16 DIAGNOSIS — D225 Melanocytic nevi of trunk: Secondary | ICD-10-CM | POA: Diagnosis not present

## 2017-03-16 DIAGNOSIS — D2271 Melanocytic nevi of right lower limb, including hip: Secondary | ICD-10-CM | POA: Diagnosis not present

## 2017-03-16 DIAGNOSIS — Z86018 Personal history of other benign neoplasm: Secondary | ICD-10-CM | POA: Diagnosis not present

## 2017-07-04 DIAGNOSIS — Z23 Encounter for immunization: Secondary | ICD-10-CM | POA: Diagnosis not present

## 2017-08-30 DIAGNOSIS — Z23 Encounter for immunization: Secondary | ICD-10-CM | POA: Diagnosis not present

## 2017-08-30 DIAGNOSIS — Z0000 Encounter for general adult medical examination without abnormal findings: Secondary | ICD-10-CM | POA: Diagnosis not present

## 2017-10-13 DIAGNOSIS — Z23 Encounter for immunization: Secondary | ICD-10-CM | POA: Diagnosis not present

## 2018-01-11 DIAGNOSIS — Z23 Encounter for immunization: Secondary | ICD-10-CM | POA: Diagnosis not present

## 2018-02-02 IMAGING — US US ABDOMEN LIMITED
1 series · 14 of 25 positions shown · non-contrast
Comparison: Abdominal ultrasound August 08, 2013

CLINICAL DATA: Right upper quadrant pain with nausea and fever for
the 6 days. History of gallstones

EXAM:
US ABDOMEN LIMITED - RIGHT UPPER QUADRANT

[Series 1: us abdomen limited · 0.21mm/px · 14 of 43 slices shown]
[im 1/43]
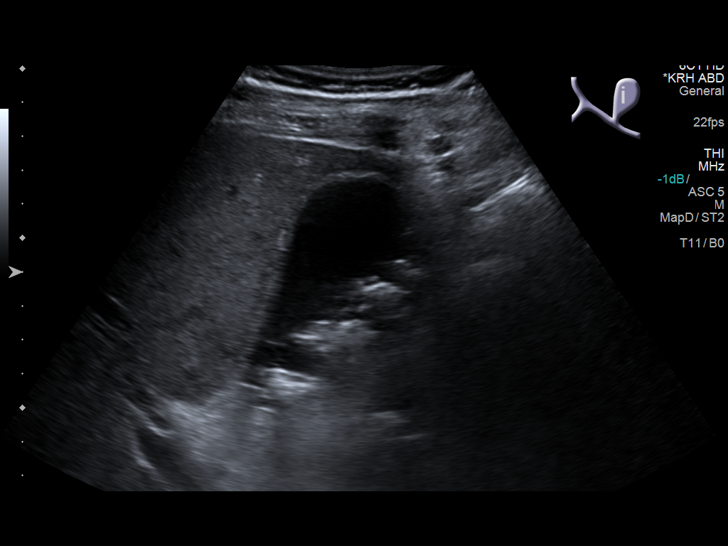
[im 4/43]
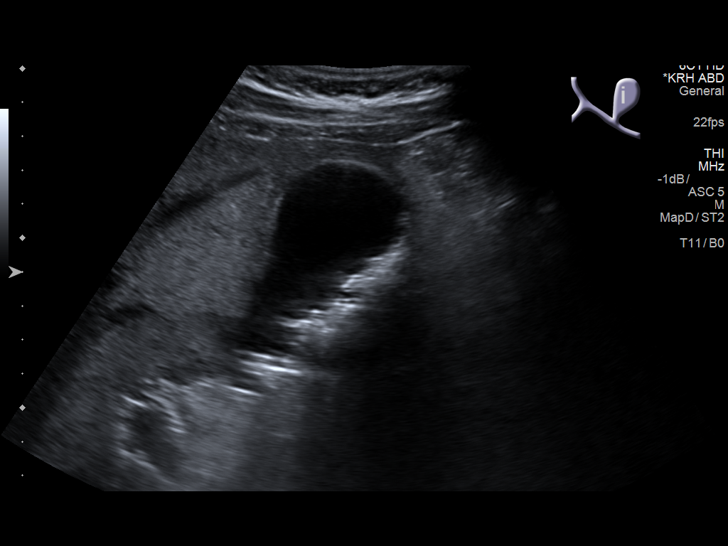
[im 8/43]
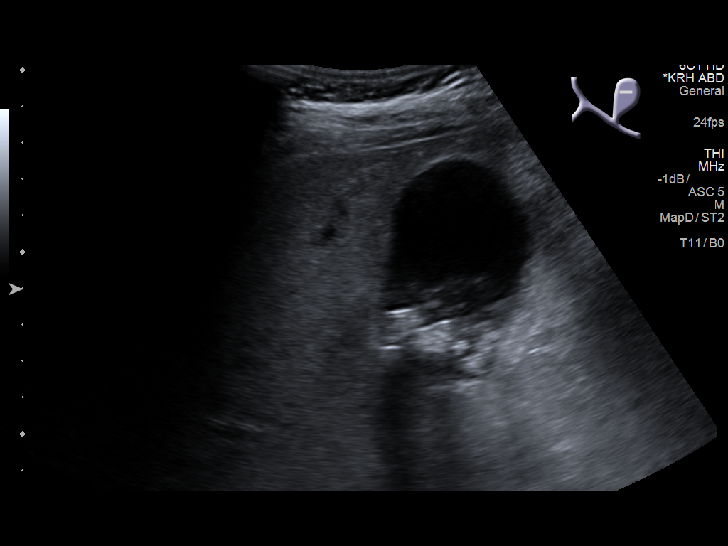
[im 11/43]
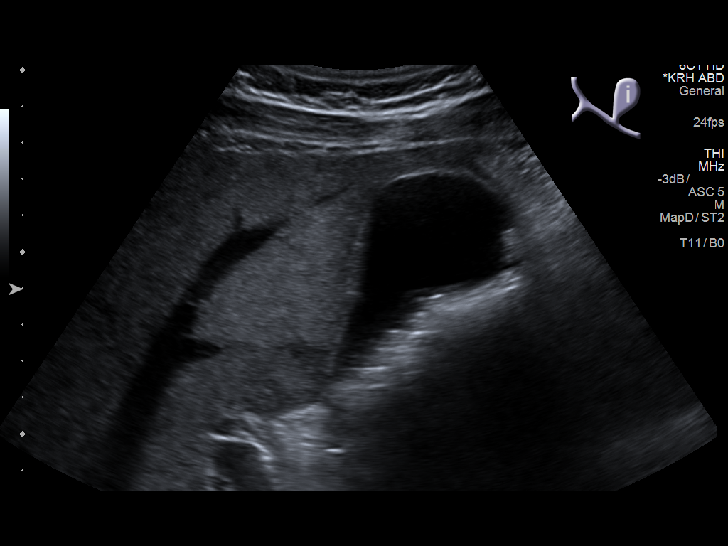
[im 15/43]
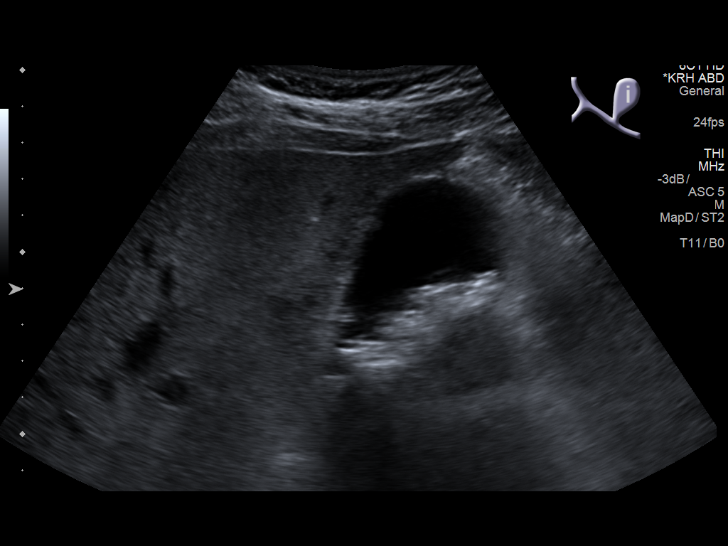
[im 16/43]
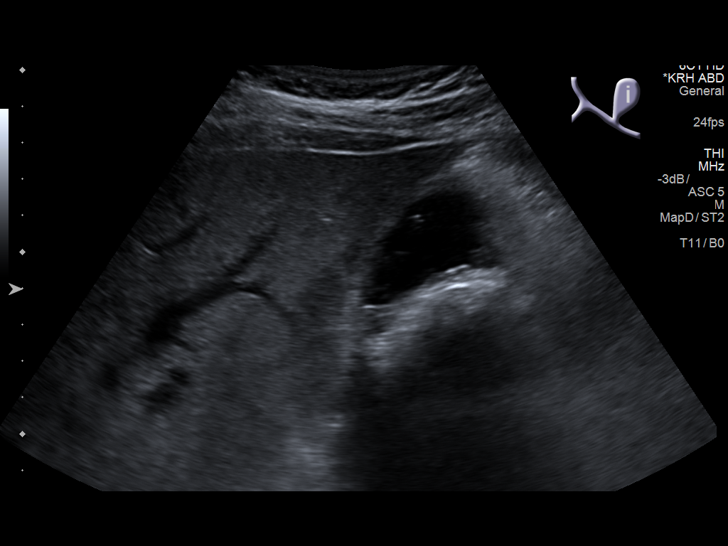
[im 20/43]
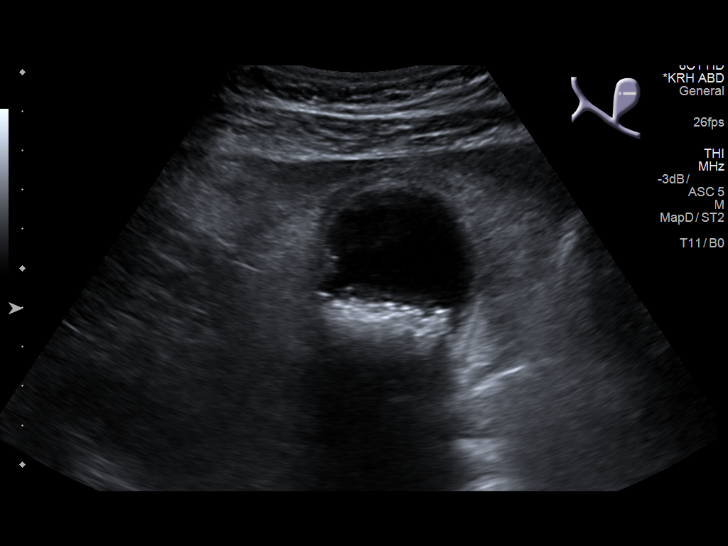
[im 23/43]
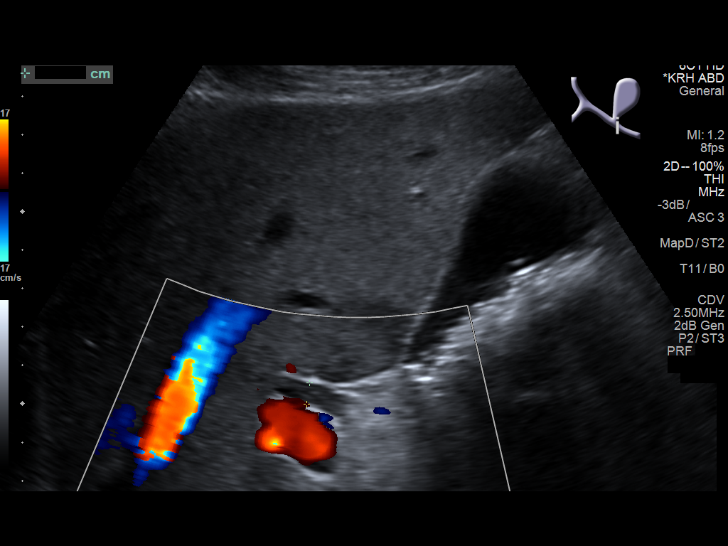
[im 27/43]
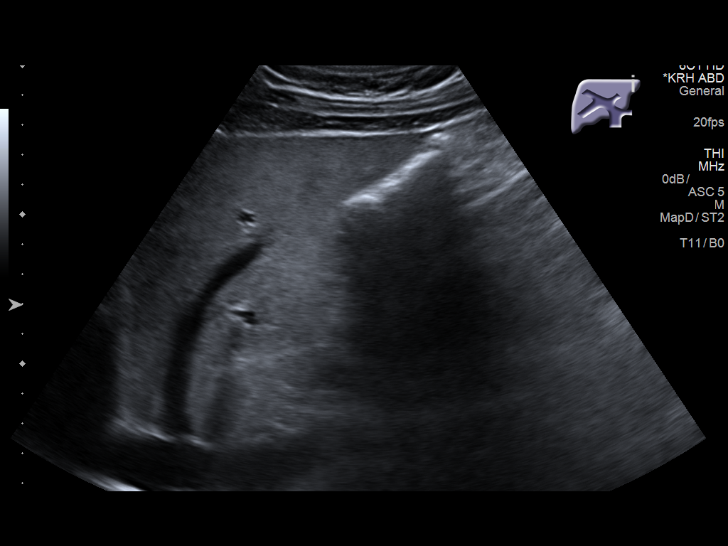
[im 29/43]
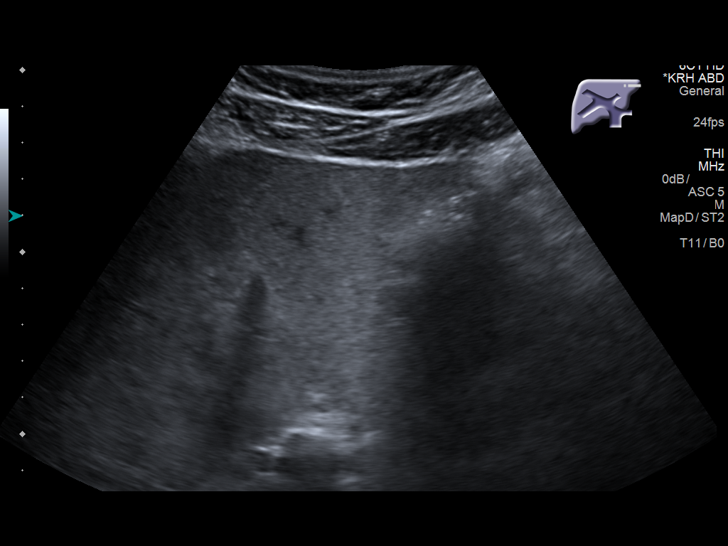
[im 32/43]
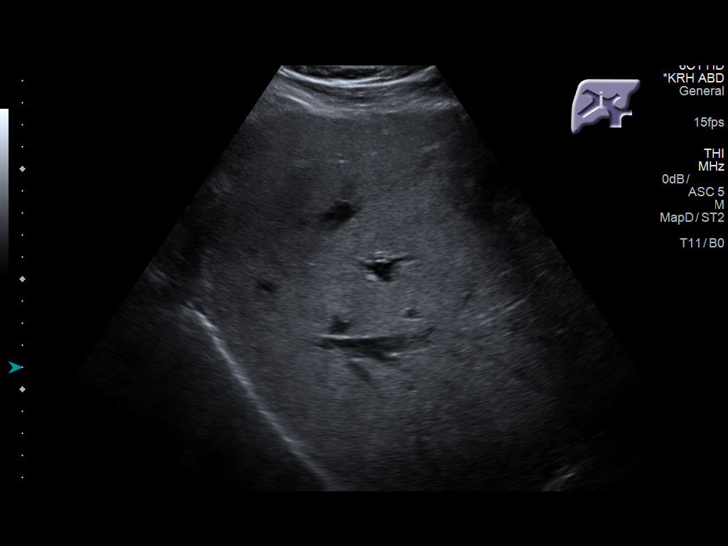
[im 36/43]
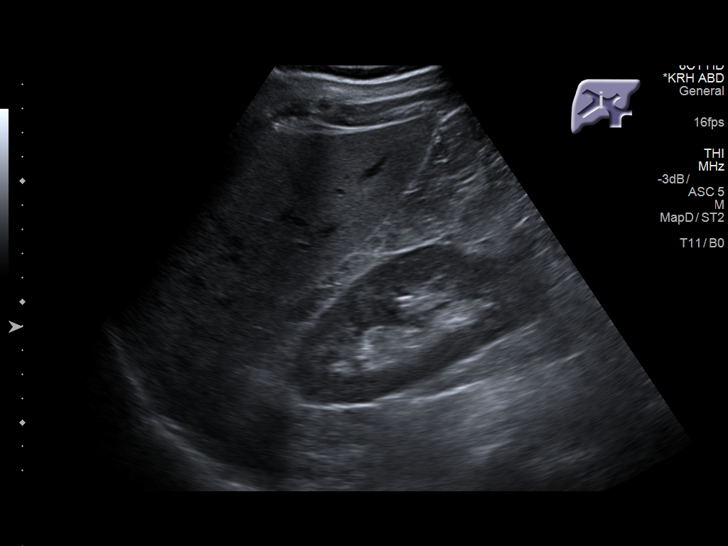
[im 39/43]
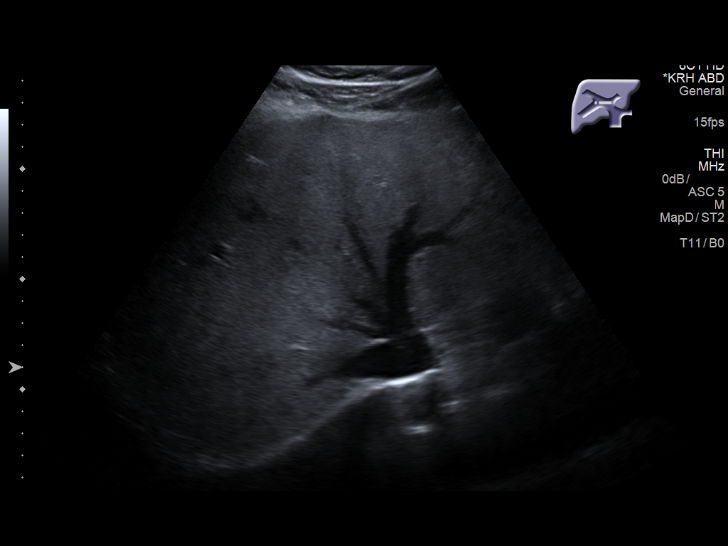
[im 43/43]
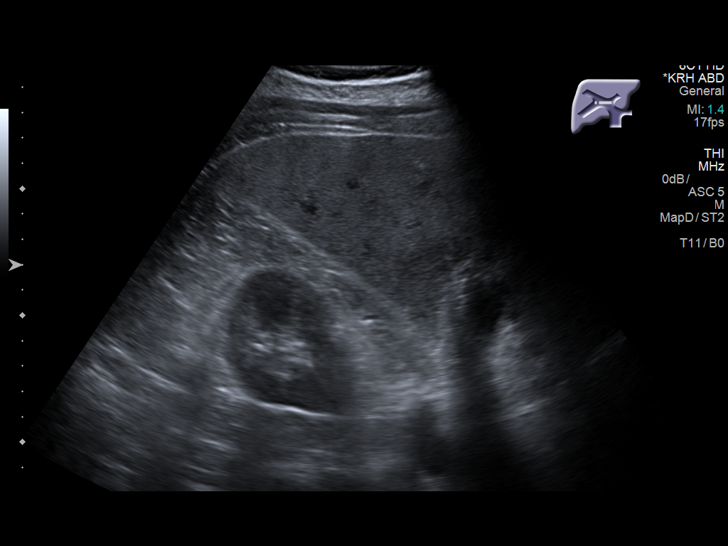

[14 of 25 positions shown; findings below may reference images not displayed]

FINDINGS: Gallbladder:

The gallbladder is adequately distended. There are multiple
echogenic mobile shadowing stones. The largest measures 1.4 cm in
diameter. There is no gallbladder wall thickening, pericholecystic
fluid, or positive sonographic Murphy's sign.

Common bile duct:

Diameter: 5.6 mm

Liver:

The hepatic echotexture is mildly increased diffusely which was
previously demonstrated. There is no focal mass nor ductal dilation.
The surface contour of the liver remains smooth.
IMPRESSION: Gallstones without sonographic evidence of acute cholecystitis.
Fatty infiltrative change of the liver.

## 2018-04-17 DIAGNOSIS — D2271 Melanocytic nevi of right lower limb, including hip: Secondary | ICD-10-CM | POA: Diagnosis not present

## 2018-04-17 DIAGNOSIS — D2272 Melanocytic nevi of left lower limb, including hip: Secondary | ICD-10-CM | POA: Diagnosis not present

## 2018-04-17 DIAGNOSIS — D485 Neoplasm of uncertain behavior of skin: Secondary | ICD-10-CM | POA: Diagnosis not present

## 2018-04-17 DIAGNOSIS — D225 Melanocytic nevi of trunk: Secondary | ICD-10-CM | POA: Diagnosis not present

## 2018-05-31 DIAGNOSIS — L7682 Other postprocedural complications of skin and subcutaneous tissue: Secondary | ICD-10-CM | POA: Diagnosis not present

## 2018-05-31 DIAGNOSIS — D485 Neoplasm of uncertain behavior of skin: Secondary | ICD-10-CM | POA: Diagnosis not present

## 2018-06-28 DIAGNOSIS — Z23 Encounter for immunization: Secondary | ICD-10-CM | POA: Diagnosis not present

## 2021-12-22 ENCOUNTER — Encounter: Payer: Self-pay | Admitting: Internal Medicine

## 2022-01-27 ENCOUNTER — Encounter: Payer: 59 | Admitting: Internal Medicine

## 2022-03-01 ENCOUNTER — Encounter: Payer: Self-pay | Admitting: Internal Medicine

## 2022-03-25 ENCOUNTER — Encounter: Payer: Self-pay | Admitting: Internal Medicine

## 2022-04-05 ENCOUNTER — Ambulatory Visit (AMBULATORY_SURGERY_CENTER): Payer: Self-pay | Admitting: *Deleted

## 2022-04-05 VITALS — Ht 75.0 in | Wt 237.0 lb

## 2022-04-05 DIAGNOSIS — Z86010 Personal history of colonic polyps: Secondary | ICD-10-CM

## 2022-04-05 NOTE — Progress Notes (Signed)

## 2022-04-19 ENCOUNTER — Encounter: Payer: Self-pay | Admitting: Internal Medicine

## 2022-04-26 ENCOUNTER — Ambulatory Visit (AMBULATORY_SURGERY_CENTER): Payer: 59 | Admitting: Internal Medicine

## 2022-04-26 ENCOUNTER — Encounter: Payer: Self-pay | Admitting: Internal Medicine

## 2022-04-26 VITALS — BP 102/65 | HR 58 | Temp 97.5°F | Resp 13 | Ht 76.0 in | Wt 237.0 lb

## 2022-04-26 DIAGNOSIS — Z09 Encounter for follow-up examination after completed treatment for conditions other than malignant neoplasm: Secondary | ICD-10-CM | POA: Diagnosis not present

## 2022-04-26 DIAGNOSIS — D127 Benign neoplasm of rectosigmoid junction: Secondary | ICD-10-CM | POA: Diagnosis not present

## 2022-04-26 DIAGNOSIS — D123 Benign neoplasm of transverse colon: Secondary | ICD-10-CM

## 2022-04-26 DIAGNOSIS — D125 Benign neoplasm of sigmoid colon: Secondary | ICD-10-CM

## 2022-04-26 DIAGNOSIS — Z86010 Personal history of colonic polyps: Secondary | ICD-10-CM

## 2022-04-26 DIAGNOSIS — D128 Benign neoplasm of rectum: Secondary | ICD-10-CM

## 2022-04-26 MED ORDER — SODIUM CHLORIDE 0.9 % IV SOLN: 500.0000 mL | Freq: Once | INTRAVENOUS | Status: DC

## 2022-04-26 NOTE — Progress Notes (Signed)
Report to PACU, RN, vss, BBS= Clear.  

## 2022-04-26 NOTE — Patient Instructions (Signed)
YOU HAD AN ENDOSCOPIC PROCEDURE TODAY AT THE [facility]:   Refer to the procedure report that was given to you for any specific questions about what was found during the examination.  If the procedure report does not answer your questions, please call your gastroenterologist to clarify.  If you requested that your care partner not be given the details of your procedure findings, then the procedure report has been included in a sealed envelope for you to review at your convenience later.  YOU SHOULD EXPECT: Some feelings of bloating in the abdomen. Passage of more gas than usual.  Walking can help get rid of the air that was put into your GI tract during the procedure and reduce the bloating. If you had a lower endoscopy (such as a colonoscopy or flexible sigmoidoscopy) you may notice spotting of blood in your stool or on the toilet paper. If you underwent a bowel prep for your procedure, you may not have a normal bowel movement for a few days.  Please Note:  You might notice some irritation and congestion in your nose or some drainage.  This is from the oxygen used during your procedure.  There is no need for concern and it should clear up in a day or so.  SYMPTOMS TO REPORT IMMEDIATELY:  Following lower endoscopy (colonoscopy or flexible sigmoidoscopy):  Excessive amounts of blood in the stool  Significant tenderness or worsening of abdominal pains  Swelling of the abdomen that is new, acute  Fever of 100F or higher  Following upper endoscopy (EGD)  Vomiting of blood or coffee ground material  New chest pain or pain under the shoulder blades  Painful or persistently difficult swallowing  New shortness of breath  Fever of 100F or higher  Black, tarry-looking stools  For urgent or emergent issues, a gastroenterologist can be reached at any hour by calling [phone]. Do not use MyChart messaging for urgent concerns.    DIET:  We do recommend a small meal at first, but  then you may proceed to your regular diet.  Drink plenty of fluids but you should avoid alcoholic beverages for 24 hours.  ACTIVITY:  You should plan to take it easy for the rest of today and you should NOT DRIVE or use heavy machinery until tomorrow (because of the sedation medicines used during the test).    FOLLOW UP: Our staff will call the number listed on your records the next business day following your procedure.  We will call around 7:15- 8:00 am to check on you and address any questions or concerns that you may have regarding the information given to you following your procedure. If we do not reach you, we will leave a message.  If you develop any symptoms (ie: fever, flu-like symptoms, shortness of breath, cough etc.) before then, please call [phone].  If you test positive for Covid 19 in the 2 weeks post procedure, please call and report this information to us.    If any biopsies were taken you will be contacted by phone or by letter within the next 1-3 weeks.  Please call us at [phone] if you have not heard about the biopsies in 3 weeks.    SIGNATURES/CONFIDENTIALITY: You and/or your care partner have signed paperwork which will be entered into your electronic medical record.  These signatures attest to the fact that that the information above on your After Visit Summary has been reviewed and is understood.  Full responsibility of the confidentiality   of this discharge information lies with you and/or your care-partner.  

## 2022-04-26 NOTE — Progress Notes (Signed)
Called to room to assist during endoscopic procedure.  Patient ID and intended procedure confirmed with present staff. Received instructions for my participation in the procedure from the performing physician.  

## 2022-04-26 NOTE — Op Note (Addendum)
[place] Patient Name: Patrick Newton Procedure Date: 04/26/2022 2:55 PM MRN: [id number] Endoscopist: Gatha Mayer , MD Age: 66 Referring MD:  Date of Birth: 1956/05/14 Gender: Male Account #: [id number] Procedure:                Colonoscopy Indications:              Surveillance: Personal history of adenomatous                            polyps on last colonoscopy 5 years ago, Last                            colonoscopy: 2018 Medicines:                Monitored Anesthesia Care Procedure:                Pre-Anesthesia Assessment:                           - Prior to the procedure, a History and Physical                            was performed, and patient medications and                            allergies were reviewed. The patient's tolerance of                            previous anesthesia was also reviewed. The risks                            and benefits of the procedure and the sedation                            options and risks were discussed with the patient.                            All questions were answered, and informed consent                            was obtained. Prior Anticoagulants: The patient has                            taken no previous anticoagulant or antiplatelet                            agents. ASA Grade Assessment: II - A patient with                            mild systemic disease. After reviewing the risks                            and benefits, the patient was deemed in  satisfactory condition to undergo the procedure.                           After obtaining informed consent, the colonoscope                            was passed under direct vision. Throughout the                            procedure, the patient's blood pressure, pulse, and                            oxygen saturations were monitored continuously. The                            Olympus CF-HQ190L ([id number]) Colonoscope was                             introduced through the anus and advanced to the the                            ileocolonic anastomosis. The colonoscopy was                            performed without difficulty. The patient tolerated                            the procedure well. The quality of the bowel                            preparation was adequate. The bowel preparation                            used was Miralax via split dose instruction. The                            rectum and Ileocolonic anastomsis areas were                            photographed. Scope In: 3:06:22 PM Scope Out: 3:31:33 PM Scope Withdrawal Time: 0 hours 19 minutes 14 seconds  Total Procedure Duration: 0 hours 25 minutes 11 seconds  Findings:                 The perianal and digital rectal examinations were                            normal.                           Five sessile polyps were found in the rectum,                            sigmoid colon and transverse colon. The polyps were  diminutive in size. These polyps were removed with                            a cold snare. Resection and retrieval were                            complete. Verification of patient identification                            for the specimen was done. Estimated blood loss was                            minimal.                           Multiple diverticula were found in the sigmoid                            colon.                           External and internal hemorrhoids were found.                           The exam was otherwise without abnormality on                            direct and retroflexion views. Complications:            No immediate complications. Estimated Blood Loss:     Estimated blood loss was minimal. Impression:               - Five diminutive polyps in the rectum, in the                            sigmoid colon and in the transverse colon, removed                            with  a cold snare. Resected and retrieved.                           - Diverticulosis in the sigmoid colon.                           - External and internal hemorrhoids.                           - The examination was otherwise normal on direct                            and retroflexion views.                           - Personal history of colonic polyps. 2012 large  adenoma - surgically resected                           2014 adenomas                           10/2016 5 mm adenoma Recommendation:           - Patient has a contact number available for                            emergencies. The signs and symptoms of potential                            delayed complications were discussed with the                            patient. Return to normal activities tomorrow.                            Written discharge instructions were provided to the                            patient.                           - Resume previous diet.                           - Continue present medications.                           - Await pathology results.                           - Repeat colonoscopy is recommended for                            surveillance. The colonoscopy date will be                            determined after pathology results from today's                            exam become available for review. Consider extra or                            different prep next time. Significant lavage and                            suctioning to achieve adequate prep. Gatha Mayer, MD 04/26/2022 3:40:00 PM This report has been signed electronically.

## 2022-04-26 NOTE — Progress Notes (Signed)
[facility] History and Physical   Primary Care Physician:  Aura Dials, MD   Reason for Procedure:   Hx colon polyps  Plan:    colonoscopy     HPI: Patrick Newton is a 66 y.o. male here for surveillance - hx polyps  2012 large adenoma - surgically resected 2014 adenomas 10/2016 5 mm adenoma - Past Medical History:  Diagnosis Date   Colon polyp    Hx of adenomatous colonic polyps 05/17/2011    Past Surgical History:  Procedure Laterality Date   CHOLECYSTECTOMY N/A 07/04/2016   Procedure: LAPAROSCOPIC CHOLECYSTECTOMY;  Surgeon: Georganna Skeans, MD;  Location: [place];  Service: General;  Laterality: N/A;   COLON SURGERY  06/20/2011   rt hemi-colectomy   COLONOSCOPY     Infected Molar  06/2014   KNEE SURGERY Left 1985   arthroscopic   Tooth Implant  2016   [place]    Prior to Admission medications   Medication Sig Start Date End Date Taking? Authorizing Provider  acetaminophen (TYLENOL) 500 MG chewable tablet Chew 1,000 mg by mouth every 6 (six) hours as needed for pain.   Yes [provider]  b complex vitamins tablet Take 1 tablet by mouth daily.   Yes [provider]  Cholecalciferol (VITAMIN D3) 125 MCG (5000 UT) TABS 1 tablet   Yes [provider]  cyanocobalamin (VITAMIN B12) 100 MCG tablet 1 tablet   Yes [provider]  fluticasone (FLONASE) 50 MCG/ACT nasal spray Place 1 spray into both nostrils daily as needed for allergies or rhinitis.   Yes [provider]  Misc Natural Products ([place] MSM FORMULA) TABS 1500/1200/'1500mg'$ : take 1 capsule   Yes [provider]  Multiple Vitamin (MULTIVITAMIN WITH MINERALS) TABS tablet Take 1 tablet by mouth daily.   Yes [provider]  Multiple Vitamins-Minerals (CENTRAVITES 50 PLUS) TABS    Yes [provider]    Current Outpatient Medications  Medication Sig Dispense Refill   acetaminophen (TYLENOL) 500  MG chewable tablet Chew 1,000 mg by mouth every 6 (six) hours as needed for pain.     b complex vitamins tablet Take 1 tablet by mouth daily.     Cholecalciferol (VITAMIN D3) 125 MCG (5000 UT) TABS 1 tablet     cyanocobalamin (VITAMIN B12) 100 MCG tablet 1 tablet     fluticasone (FLONASE) 50 MCG/ACT nasal spray Place 1 spray into both nostrils daily as needed for allergies or rhinitis.     Misc Natural Products (GLUCOSAMINE CHOND MSM FORMULA) TABS 1500/1200/'1500mg'$ : take 1 capsule     Multiple Vitamin (MULTIVITAMIN WITH MINERALS) TABS tablet Take 1 tablet by mouth daily.     Multiple Vitamins-Minerals (CENTRAVITES 50 PLUS) TABS      Current Facility-Administered Medications  Medication Dose Route Frequency Provider Last Rate Last Admin   0.9 %  sodium chloride infusion  500 mL Intravenous Continuous Gatha Mayer, MD       0.9 %  sodium chloride infusion  500 mL Intravenous Once Gatha Mayer, MD        Allergies as of 04/26/2022   (No Known Allergies)    Family History  Problem Relation Age of Onset   Cancer Mother        pancreatic   Cancer Father        abdominal   Cancer Maternal Aunt        breast   Colon cancer Maternal Grandmother  Colon polyps Neg Hx    Esophageal cancer Neg Hx    Stomach cancer Neg Hx    Rectal cancer Neg Hx     Social History   Socioeconomic History   Marital status: Married    Spouse name: Not on file   Number of children: Not on file   Years of education: Not on file   Highest education level: Not on file  Occupational History   Not on file  Tobacco Use   Smoking status: Never   Smokeless tobacco: Never  Vaping Use   Vaping Use: Never used  Substance and Sexual Activity   Alcohol use: Yes    Comment: one beer every 2 weeks or less   Drug use: No   Sexual activity: Not on file  Other Topics Concern   Not on file  Social History Narrative   Not on file   Social Determinants of Health   Financial Resource Strain: Not on file   Food Insecurity: Not on file  Transportation Needs: Not on file  Physical Activity: Not on file  Stress: Not on file  Social Connections: Not on file  Intimate Partner Violence: Not on file    Review of Systems:  All other review of systems negative except as mentioned in the HPI.  Physical Exam: Vital signs BP 137/81   Pulse 65   Temp (!) 97.5 F (36.4 C) (Temporal)   Resp (!) 9   Ht '6\' 4"'$  (1.93 m)   Wt 237 lb (107.5 kg)   SpO2 99%   BMI 28.85 kg/m   General:   Alert,  Well-developed, well-nourished, pleasant and cooperative in NAD Lungs:  Clear throughout to auscultation.   Heart:  Regular rate and rhythm; no murmurs, clicks, rubs,  or gallops. Abdomen:  Soft, nontender and nondistended. Normal bowel sounds.   Neuro/Psych:  Alert and cooperative. Normal mood and affect. A and O x 3   '@Adonay Scheier'$  Simonne Maffucci, MD, [facility] [phone] (pager) 04/26/2022 2:58 PM@

## 2022-04-26 NOTE — Progress Notes (Signed)
VS completed by DT.  Pt's states no medical or surgical changes since previsit or office visit.  

## 2022-04-27 ENCOUNTER — Telehealth: Payer: Self-pay

## 2022-04-27 NOTE — Telephone Encounter (Signed)
  Follow up Call-     04/26/2022    2:01 PM  Call back number  Post procedure Call Back phone  # [phone]  Permission to leave phone message Yes     Patient questions:  Do you have a fever, pain , or abdominal swelling? No. Pain Score  0 *  Have you tolerated food without any problems? Yes.    Have you been able to return to your normal activities? Yes.    Do you have any questions about your discharge instructions: Diet   No. Medications  No. Follow up visit  No.  Do you have questions or concerns about your Care? No.  Actions: * If pain score is 4 or above: No action needed, pain <4.

## 2022-05-02 ENCOUNTER — Encounter: Payer: Self-pay | Admitting: Internal Medicine

## 2022-05-02 DIAGNOSIS — Z86010 Personal history of colonic polyps: Secondary | ICD-10-CM
